# Patient Record
Sex: Male | Born: 1960 | Race: Black or African American | Hispanic: No | Marital: Married | Smoking: Former smoker
Health system: Southern US, Community
[De-identification: ages and names within clinical notes are randomized; demographics above are authoritative.]

## PROBLEM LIST (undated history)

## (undated) DIAGNOSIS — J309 Allergic rhinitis, unspecified: Secondary | ICD-10-CM

## (undated) DIAGNOSIS — I10 Essential (primary) hypertension: Secondary | ICD-10-CM

## (undated) DIAGNOSIS — M1990 Unspecified osteoarthritis, unspecified site: Secondary | ICD-10-CM

## (undated) DIAGNOSIS — E785 Hyperlipidemia, unspecified: Secondary | ICD-10-CM

## (undated) DIAGNOSIS — Z972 Presence of dental prosthetic device (complete) (partial): Secondary | ICD-10-CM

## (undated) HISTORY — DX: Allergic rhinitis, unspecified: J30.9

## (undated) HISTORY — DX: Essential (primary) hypertension: I10

## (undated) HISTORY — PX: HERNIA REPAIR: SHX51

## (undated) HISTORY — DX: Hyperlipidemia, unspecified: E78.5

---

## 1998-11-25 ENCOUNTER — Ambulatory Visit (HOSPITAL_COMMUNITY): Admission: RE | Admit: 1998-11-25 | Discharge: 1998-11-25 | Payer: Self-pay | Admitting: Neurological Surgery

## 1998-11-25 ENCOUNTER — Encounter: Payer: Self-pay | Admitting: Neurological Surgery

## 2007-08-08 ENCOUNTER — Ambulatory Visit: Payer: Self-pay | Admitting: General Surgery

## 2010-07-07 ENCOUNTER — Ambulatory Visit: Payer: Self-pay | Admitting: Gastroenterology

## 2010-07-07 HISTORY — PX: COLONOSCOPY: SHX174

## 2011-11-23 ENCOUNTER — Emergency Department: Payer: Self-pay | Admitting: Emergency Medicine

## 2015-09-12 ENCOUNTER — Telehealth: Payer: Self-pay | Admitting: Family Medicine

## 2015-09-12 NOTE — Telephone Encounter (Signed)
Pt's wife Larene Beach would like to get pt re-established with Nadine Counts. I believe pt is still a paper chart for our office I didn't find much in [employer]. Insurance [employer]. No medications or medical conditions. Wife stated pt has issues with allergies that he needs to be seen for. Can we re-establish pt? Please advise. Thanks TNP

## 2015-09-13 NOTE — Telephone Encounter (Signed)
Spoke with pt's wife and schedule appt for 09/16/15 @ 9 am. Thanks TNP

## 2015-09-13 NOTE — Telephone Encounter (Signed)
Ok to re-establish with 30 minute appointment

## 2015-09-14 DIAGNOSIS — M4802 Spinal stenosis, cervical region: Secondary | ICD-10-CM | POA: Insufficient documentation

## 2015-09-14 DIAGNOSIS — E7800 Pure hypercholesterolemia, unspecified: Secondary | ICD-10-CM | POA: Insufficient documentation

## 2015-09-14 DIAGNOSIS — E785 Hyperlipidemia, unspecified: Secondary | ICD-10-CM | POA: Insufficient documentation

## 2015-09-14 DIAGNOSIS — J309 Allergic rhinitis, unspecified: Secondary | ICD-10-CM | POA: Insufficient documentation

## 2015-09-16 ENCOUNTER — Encounter: Payer: Self-pay | Admitting: Family Medicine

## 2015-09-16 ENCOUNTER — Ambulatory Visit (INDEPENDENT_AMBULATORY_CARE_PROVIDER_SITE_OTHER): Payer: 59 | Admitting: Family Medicine

## 2015-09-16 VITALS — BP 144/100 | HR 76 | Temp 97.6°F | Resp 16 | Ht 67.0 in | Wt 189.8 lb

## 2015-09-16 DIAGNOSIS — J309 Allergic rhinitis, unspecified: Secondary | ICD-10-CM | POA: Diagnosis not present

## 2015-09-16 DIAGNOSIS — IMO0001 Reserved for inherently not codable concepts without codable children: Secondary | ICD-10-CM

## 2015-09-16 DIAGNOSIS — R030 Elevated blood-pressure reading, without diagnosis of hypertension: Secondary | ICD-10-CM

## 2015-09-16 MED ORDER — FLUTICASONE PROPIONATE 50 MCG/ACT NA SUSP: 2.0000 | Freq: Every day | NASAL | Status: DC

## 2015-09-16 NOTE — Patient Instructions (Signed)
Add Claritin daily to the nasal spray. Come in for nurse bp checks once or twice a week for 2 weeks.

## 2015-09-16 NOTE — Progress Notes (Signed)
Subjective:     Patient ID: Grant Golden, male   DOB: [birth year], 55 y.o.   MRN: [id number]  HPI  Chief Complaint  Patient presents with  . Establish Care    Patient comes into office today to reestablish, patient states that during his absence he had not been seen by another physcian. Patient wanted to address his allergies today, patient reports for the past month that he has had a runny nose. Patient denies taking any otc medication, he stated that when he blows his nose he feels dizzy and this is more noticable in the mornings.   States he has been allergy tested in the past and is allergic "to everything" except the cat they have at home. Reports taking Claritin intermittently for his sx.   Review of Systems  Cardiovascular:       Raised in foster homes so not sure of his family history.       Objective:   Physical Exam  Constitutional: He appears well-developed and well-nourished. No distress.  Cardiovascular: Normal rate and regular rhythm.   Ears: T.M's intact without inflammation Throat: no tonsillar enlargement or exudate Neck: no cervical adenopathy Lungs: clear     Assessment:    1. Allergic rhinitis, unspecified allergic rhinitis typ - fluticasone (FLONASE) 50 MCG/ACT nasal spray; Place 2 sprays into both nostrils daily.  Dispense: 16 g; Refill: 6  2. Elevated blood pressure    Plan:    Add Claritin daily. Nurse bp checks 1-2 x week over the next two weeks.

## 2015-12-26 ENCOUNTER — Encounter: Payer: Self-pay | Admitting: Family Medicine

## 2015-12-26 ENCOUNTER — Ambulatory Visit (INDEPENDENT_AMBULATORY_CARE_PROVIDER_SITE_OTHER): Payer: 59 | Admitting: Family Medicine

## 2015-12-26 VITALS — BP 136/70 | HR 82 | Temp 98.2°F | Resp 16 | Wt 186.4 lb

## 2015-12-26 DIAGNOSIS — J069 Acute upper respiratory infection, unspecified: Secondary | ICD-10-CM | POA: Diagnosis not present

## 2015-12-26 NOTE — Patient Instructions (Signed)
Continue with Mucinex and add Delsym for cough. For sinus congestion add decongestant and Benadryl at night for post nasal drainage.

## 2015-12-26 NOTE — Progress Notes (Signed)
Subjective:     Patient ID: Grant SchirmerFrank B Janssen, male   DOB: 07-21-1961, 55 y.o.   MRN: [id number]  HPI  Chief Complaint  Patient presents with  . Cough    Patient comes in office today with complaints of cough and chest congestion 12/23/15. Patient states that when he woke the next morning he had body aches, chills and fever that was high of 100. Associated with cough patient complains of sore throat. He has been taking otc Mucinex with no relief.       Review of Systems     Objective:   Physical Exam  Constitutional: He appears well-developed and well-nourished. No distress.  Ears: T.M's intact without inflammation Throat: no tonsillar enlargement or exudate, mild posterior pharyngeal erythema. Neck: no cervical adenopathy Lungs: clear     Assessment:    1. Upper respiratory infection    Plan:    Discussed otc medication. Work excuse for 5/8-5/9.

## 2015-12-27 ENCOUNTER — Telehealth: Payer: Self-pay | Admitting: Family Medicine

## 2015-12-27 NOTE — Telephone Encounter (Signed)
Tried calling patient, no answer. Will try again later.  

## 2015-12-27 NOTE — Telephone Encounter (Signed)
Pt is returning call.  [phone]/WJCB#[phone]/MW

## 2015-12-27 NOTE — Telephone Encounter (Signed)
Patient was advised to take otc medication as directed in office visit. KW

## 2015-12-27 NOTE — Telephone Encounter (Signed)
Pt stated that when he was here yesterday 12/26/15 he saw Nadine CountsBob and was advised to start taking medication and thought [facility]. Pt stated that when he was leaving he asked someone in the office if his scripts had been sent in and he said he was advised that they were sent in for him. I didn't see any new RX for pt and the medications he was advised to try in the OV note are all OTC medications. Pt just wanted to make sure that he didn't need an antibiotic. Please advise. Thanks TNP

## 2016-01-05 ENCOUNTER — Ambulatory Visit (INDEPENDENT_AMBULATORY_CARE_PROVIDER_SITE_OTHER): Payer: 59 | Admitting: Family Medicine

## 2016-01-05 ENCOUNTER — Encounter: Payer: Self-pay | Admitting: Family Medicine

## 2016-01-05 VITALS — BP 100/60 | HR 54 | Temp 98.2°F | Resp 16 | Wt 186.0 lb

## 2016-01-05 DIAGNOSIS — R059 Cough, unspecified: Secondary | ICD-10-CM

## 2016-01-05 DIAGNOSIS — R05 Cough: Secondary | ICD-10-CM

## 2016-01-05 DIAGNOSIS — J029 Acute pharyngitis, unspecified: Secondary | ICD-10-CM | POA: Diagnosis not present

## 2016-01-05 MED ORDER — SUCRALFATE 1 G PO TABS: 1.0000 g | ORAL_TABLET | Freq: Three times a day (TID) | ORAL | Status: DC

## 2016-01-05 NOTE — Progress Notes (Signed)
Subjective:     Patient ID: Grant SchirmerFrank B Bow, male   DOB: Jul 01, 1961, 55 y.o.   MRN: [id number]  HPI  Chief Complaint  Patient presents with  . Sore Throat    Patient comes in office today with complaints of sore throat since 12/27/15. Patient states that he was seen in office 12/26/15 and reported having flu like symptoms, patient states that he has been taing otc medication but sore throat seemed to get worse these past few days. Associated patient complains of chest congestion and bilateral ear pain.   States his throat bothers him the most in the AM then will recur at the end of the day. Cough is productive of white mucus. Reports he does not feel particularly sick. He does lift in his job and weight lifts to keep in shape. Has not been working out since he started to feel bad.   Review of Systems     Objective:   Physical Exam  Constitutional: He appears well-developed and well-nourished. No distress.  Ears: T.M's intact without inflammation Sinuses: non-tender Throat: no tonsillar enlargement or exudate Neck: no cervical adenopathy Lungs: clear     Assessment:    1. Sore throat: suspect mediated by reflux - sucralfate (CARAFATE) 1 g tablet; Take 1 tablet (1 g total) by mouth 4 (four) times daily -  with meals and at bedtime.  Dispense: 28 tablet; Refill: 0  2. Cough: ? Reflux mediated-samples of Dexilant #10 provided. - sucralfate (CARAFATE) 1 g tablet; Take 1 tablet (1 g total) by mouth 4 (four) times daily -  with meals and at bedtime.  Dispense: 28 tablet; Refill: 0    Plan:   Phone f/u next week.

## 2016-01-05 NOTE — Patient Instructions (Signed)
Take one Dexilant in the evening. Give it a few days to turn down the acid pumps. Let me know next week how you are doing.

## 2016-10-23 ENCOUNTER — Encounter: Payer: Self-pay | Admitting: Family Medicine

## 2016-10-23 ENCOUNTER — Ambulatory Visit (INDEPENDENT_AMBULATORY_CARE_PROVIDER_SITE_OTHER): Payer: 59 | Admitting: Family Medicine

## 2016-10-23 VITALS — BP 144/90 | HR 88 | Temp 98.0°F | Resp 16 | Ht 67.5 in | Wt 185.6 lb

## 2016-10-23 DIAGNOSIS — Z0000 Encounter for general adult medical examination without abnormal findings: Secondary | ICD-10-CM | POA: Diagnosis not present

## 2016-10-23 DIAGNOSIS — Z1159 Encounter for screening for other viral diseases: Secondary | ICD-10-CM

## 2016-10-23 DIAGNOSIS — E782 Mixed hyperlipidemia: Secondary | ICD-10-CM | POA: Diagnosis not present

## 2016-10-23 DIAGNOSIS — J3089 Other allergic rhinitis: Secondary | ICD-10-CM | POA: Diagnosis not present

## 2016-10-23 DIAGNOSIS — R030 Elevated blood-pressure reading, without diagnosis of hypertension: Secondary | ICD-10-CM

## 2016-10-23 MED ORDER — FLUTICASONE PROPIONATE 50 MCG/ACT NA SUSP: 2.0000 | Freq: Every day | NASAL | 6 refills | Status: DC

## 2016-10-23 NOTE — Progress Notes (Signed)
Subjective:     Patient ID: Grant Golden, male   DOB: 01-06-1961, 56 y.o.   MRN: [id number]  HPI  Chief Complaint  Patient presents with  . Annual Exam    Patient comes in office today for his annual physical he states that he feels well today and has no complaints or concerns to address. Patient reports that he follows a well balance diet but is not actively exercising, patient averages up to 7hrs of sleep a night. Patient last reported Tdap was 6/60/11, he is due today for Flu vaccine but has declined. Last reported Colonoscopy was 07/07/10 WNL, PSA 03/01/10.  States he continues to work for [employer] on first shift. 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 This resolves by the next day. He has discontinued weight lifting this year out of concern for his back.States he is going to a 7 day/week work cycle.   Review of Systems General: Feeling well HEENT: annual dental visits and eye exams. Perennial allergies controlled by Flonase and he wishes a refill on medication. Cardiovascular: no chest pain, shortness of breath, or palpitations GI: no heartburn, no change in bowel habits or blood in the stool GU: nocturia x 0- 1, no change in bladder habits  Psychiatric: not depressed, but reports decreased motivation for work where he has been for 30 years. Musculoskeletal: recurrent back tightness as above.    Objective:   Physical Exam  Constitutional: He appears well-developed and well-nourished. No distress.  Eyes: PERRLA, EOMI Neck: no thyromegaly, tenderness or nodules, no carotid bruits or cervical nodes appreciated ENT: TM's intact without inflammation; No tonsillar enlargement or exudate, Lungs: Clear Heart : RRR without murmur or gallop Abd: bowel sounds present, soft, non-tender, no organomegaly Rectal: Prostate not well felt by examiner Extremities: no edema. SLR's negative for back pain or  radiation of pain     Assessment:    1. Annual physical exam - Comprehensive metabolic panel - PSA  2. Elevated blood-pressure reading without diagnosis of hypertension  3. Chronic non-seasonal allergic rhinitis, unspecified trigger - fluticasone (FLONASE) 50 MCG/ACT nasal spray; Place 2 sprays into both nostrils daily.  Dispense: 16 g; Refill: 6  4. Mixed hyperlipidemia - Lipid panel  5. Encounter for hepatitis C screening test for low risk patient - Hepatitis C Antibody    Plan:    Return for nurse bp check in one week. Further f/u pending lab work. Provided with a back exercise sheet and discussed using heat at the end of his workday to relieve spasms.

## 2016-10-23 NOTE — Patient Instructions (Signed)
Please return for a nurse bp check in a week. We will call you with the lab results. Apply heat for 20 minutes for back tightness at the end of your workday. Try the back exercise daily before work.

## 2016-10-24 ENCOUNTER — Telehealth: Payer: Self-pay

## 2016-10-24 LAB — COMPREHENSIVE METABOLIC PANEL
ALT: 26 IU/L (ref 0–44)
AST: 22 IU/L (ref 0–40)
Albumin/Globulin Ratio: 1.4 (ref 1.2–2.2)
Albumin: 4.2 g/dL (ref 3.5–5.5)
Alkaline Phosphatase: 91 IU/L (ref 39–117)
BUN/Creatinine Ratio: 13 (ref 9–20)
BUN: 11 mg/dL (ref 6–24)
Bilirubin Total: 0.3 mg/dL (ref 0.0–1.2)
CO2: 24 mmol/L (ref 18–29)
Calcium: 9.7 mg/dL (ref 8.7–10.2)
Chloride: 100 mmol/L (ref 96–106)
Creatinine, Ser: 0.83 mg/dL (ref 0.76–1.27)
GFR calc Af Amer: 113 mL/min/{1.73_m2} (ref >59)
GFR calc non Af Amer: 98 mL/min/{1.73_m2} (ref >59)
Globulin, Total: 3 g/dL (ref 1.5–4.5)
Glucose: 102 mg/dL — ABNORMAL HIGH (ref 65–99)
Potassium: 4.3 mmol/L (ref 3.5–5.2)
Sodium: 139 mmol/L (ref 134–144)
Total Protein: 7.2 g/dL (ref 6.0–8.5)

## 2016-10-24 LAB — LIPID PANEL
Chol/HDL Ratio: 4.8 ratio units (ref 0.0–5.0)
Cholesterol, Total: 262 mg/dL — ABNORMAL HIGH (ref 100–199)
HDL: 55 mg/dL (ref >39)
LDL Calculated: 186 mg/dL — ABNORMAL HIGH (ref 0–99)
Triglycerides: 103 mg/dL (ref 0–149)
VLDL Cholesterol Cal: 21 mg/dL (ref 5–40)

## 2016-10-24 LAB — HEPATITIS C ANTIBODY: Hep C Virus Ab: 0.1 s/co ratio (ref 0.0–0.9)

## 2016-10-24 LAB — PSA: Prostate Specific Ag, Serum: 1.4 ng/mL (ref 0.0–4.0)

## 2016-10-24 NOTE — Telephone Encounter (Signed)
LMTCB-KW 

## 2016-10-24 NOTE — Telephone Encounter (Signed)
-----   Message from Anola Gurneyobert Chauvin, [place] sent at 10/24/2016  7:34 AM EST ----- Labs are ok except your cholesterol. Your calculated 10 year risk for developing cardiovascular disease is 9.4%. We usually start a cholesterol lowering drug at 7.5% risk. Do you wish to proceed with medication?

## 2016-10-24 NOTE — Telephone Encounter (Signed)
Pt does agree to start medication. [facility]. Emily Drozdowski, CMA

## 2016-10-25 ENCOUNTER — Other Ambulatory Visit: Payer: Self-pay | Admitting: Family Medicine

## 2016-10-25 DIAGNOSIS — E782 Mixed hyperlipidemia: Secondary | ICD-10-CM

## 2016-10-25 MED ORDER — PRAVASTATIN SODIUM 40 MG PO TABS: 40.0000 mg | ORAL_TABLET | Freq: Every day | ORAL | 3 refills | Status: DC

## 2016-10-25 NOTE — Telephone Encounter (Signed)
Have sent in medication. Call for lab slip in 6-8 weeks to recheck your cholesterol.

## 2016-10-25 NOTE — Telephone Encounter (Signed)
LMTCB-KW 

## 2016-10-26 NOTE — Telephone Encounter (Signed)
LMTCB-KW 

## 2016-10-29 NOTE — Telephone Encounter (Signed)
LMTCB-KW 

## 2016-10-31 ENCOUNTER — Telehealth: Payer: Self-pay

## 2016-10-31 NOTE — Telephone Encounter (Signed)
Patient walked in office today for nurse blood pressure check he states that he felt fine and had no questions to address, patients heart rate was 82 and respiration was 16. Blood pressure was take using a large cuff on left arm, reading was 142/82. KW

## 2016-11-01 NOTE — Telephone Encounter (Signed)
Patient was aware. KW

## 2016-11-01 NOTE — Telephone Encounter (Signed)
Let's do another nurse bp check next week.

## 2016-11-01 NOTE — Telephone Encounter (Signed)
LMTCB

## 2016-11-05 NOTE — Telephone Encounter (Signed)
Patient advised about another blood pressure reading needing to be check. He will come in tomorrow in the morning.  Thanks,  -Vietta Bonifield

## 2016-11-05 NOTE — Telephone Encounter (Signed)
LMTCB-KW 

## 2016-11-06 ENCOUNTER — Telehealth: Payer: Self-pay

## 2016-11-06 NOTE — Telephone Encounter (Signed)
Patient walked in office this morning for a nurse blood pressure check, he states that he is feeling well today. Patients blood pressure was taken on left arm with large cuff, reading was 152/90. Patient denied being under any stress or having frequent headaches, he states at times he does fill dizzy. Discussed with P.A about reading and as advised instructed patient to return in one week for office visit to address elevated blood pressure.

## 2016-11-13 ENCOUNTER — Ambulatory Visit (INDEPENDENT_AMBULATORY_CARE_PROVIDER_SITE_OTHER): Payer: 59 | Admitting: Family Medicine

## 2016-11-13 ENCOUNTER — Encounter: Payer: Self-pay | Admitting: Family Medicine

## 2016-11-13 VITALS — BP 134/78 | HR 79 | Temp 98.5°F | Resp 16 | Wt 188.8 lb

## 2016-11-13 DIAGNOSIS — R030 Elevated blood-pressure reading, without diagnosis of hypertension: Secondary | ICD-10-CM

## 2016-11-13 NOTE — Progress Notes (Signed)
Subjective:     Patient ID: Grant Golden, male   DOB: July 16, 1961, 56 y.o.   MRN: [id number]  HPI  Chief Complaint  Patient presents with  . Hypertension    Patient comes in office today with concerns of elevated blood pressure over the past month. Patient was seen for two nurse visits this month first one on 10/31/16, blood pressure at visit was 142/82. On 11/06/16 at nurse visit patient blood pressure was 152/90 and he reported dizziness. Patient has been checking readings at home with a stystolic reading of 133-165 and diastolic reading of 83-110. Patient still reports that at times he has dizzy spells.   States his father died on 3/18. No family hx of HTN or chronic nsaid use. Home bp's in the last weeks have ranged from 130's-165/80's-110. States he is tolerating pravastatin well.   Review of Systems     Objective:   Physical Exam  Constitutional: He appears well-developed and well-nourished. No distress.       Assessment:    1. Elevated blood-pressure reading without diagnosis of hypertension: ? situational     Plan:    Return in 4 weeks for recheck of bp and cholesterol.

## 2016-11-13 NOTE — Patient Instructions (Signed)
We will see you in 4 weeks. Continue to take your blood pressure 2-3 x week and record it for my review. We will check your cholesterol at next visit.

## 2016-12-11 ENCOUNTER — Encounter: Payer: Self-pay | Admitting: Family Medicine

## 2016-12-11 ENCOUNTER — Ambulatory Visit: Payer: 59 | Admitting: Family Medicine

## 2016-12-11 ENCOUNTER — Ambulatory Visit (INDEPENDENT_AMBULATORY_CARE_PROVIDER_SITE_OTHER): Payer: 59 | Admitting: Family Medicine

## 2016-12-11 VITALS — BP 122/88 | HR 72 | Temp 98.1°F | Resp 16 | Wt 184.6 lb

## 2016-12-11 DIAGNOSIS — M25532 Pain in left wrist: Secondary | ICD-10-CM

## 2016-12-11 DIAGNOSIS — G8929 Other chronic pain: Secondary | ICD-10-CM | POA: Diagnosis not present

## 2016-12-11 DIAGNOSIS — E782 Mixed hyperlipidemia: Secondary | ICD-10-CM | POA: Diagnosis not present

## 2016-12-11 DIAGNOSIS — J029 Acute pharyngitis, unspecified: Secondary | ICD-10-CM | POA: Diagnosis not present

## 2016-12-11 DIAGNOSIS — R030 Elevated blood-pressure reading, without diagnosis of hypertension: Secondary | ICD-10-CM

## 2016-12-11 MED ORDER — MELOXICAM 15 MG PO TABS: 15.0000 mg | ORAL_TABLET | Freq: Every day | ORAL | 0 refills | Status: DC

## 2016-12-11 NOTE — Progress Notes (Signed)
Subjective:     Patient ID: Grant Golden, male   DOB: 08/21/1960, 56 y.o.   MRN: [id number]  HPI  Chief Complaint  Patient presents with  . Hypertension    Patient comes in office today to address concerns of elevated blood pressure, last office visit was 11/07/16 blood pressure at visit was 134/78. Patient states since last visit it seems his blood pressure has been fluctuating, patient states that stressors have been lowered and he is doing well otherwise.   Marland Kitchen URI    Patient reports chest congestion and cough, patient reports that when he cough he feels a sharp pain on the right side of his neck radiating up to his ear. Patient describes pain radiating up to ear as " someone is clawing at my throat".   States his blood pressure was ok at recent dentist visit. Was seen in May for sore throat/cough felt secondary to reflux. He does not remember if Dexilant helped Did not take the Carafate. States his sore throat has been exacerbated by his recent URI sx. 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  Review of Systems     Objective:   Physical Exam  Constitutional: He appears well-developed and well-nourished. No distress.  Musculoskeletal:  Mild tenderness over left ulnar styloid. Increased pain with supination. WF/WE 5/5. No increased pain on wrist ligament testing.  Ears: T.M's intact without inflammation Throat: no tonsillar enlargement or exudate Neck: no cervical adenopathy Lungs: end expiratory wheezes     Assessment:    1. Sore throat: ? Reflux mediated  2. Wrist pain, chronic, left - meloxicam (MOBIC) 15 MG tablet; Take 1 tablet (15 mg total) by mouth daily.  Dispense: 30 tablet; Refill: 0  3. Mixed hyperlipidemia: check response to pravastatin - Lipid panel  4. Elevated blood-pressure reading without diagnosis of hypertension: normotensive today    Plan:    Discussed  use of Neoprene wrist splint. Consider x-ray/orthopedic referral. Samples of Nexium x 8 days-to continue otc if it helps. Further f/u pending lab results.

## 2016-12-11 NOTE — Patient Instructions (Signed)
Try to get a neoprene wrist splint to get your wrist more support. Try the meloxicam daily with food (don't mix with Aleve or Advil). Continue Nexium for at least two weeks (over the counter) if helping your throat pain. We will call you with the lab results.

## 2017-01-07 ENCOUNTER — Other Ambulatory Visit: Payer: Self-pay | Admitting: Family Medicine

## 2017-01-07 DIAGNOSIS — G8929 Other chronic pain: Secondary | ICD-10-CM

## 2017-01-07 DIAGNOSIS — M25532 Pain in left wrist: Principal | ICD-10-CM

## 2017-04-15 ENCOUNTER — Ambulatory Visit (INDEPENDENT_AMBULATORY_CARE_PROVIDER_SITE_OTHER): Payer: BLUE CROSS/BLUE SHIELD | Admitting: Family Medicine

## 2017-04-15 ENCOUNTER — Ambulatory Visit
Admission: RE | Admit: 2017-04-15 | Discharge: 2017-04-15 | Disposition: A | Discharge status: Home / Self Care | Payer: BLUE CROSS/BLUE SHIELD | Source: Ambulatory Visit | Attending: Family Medicine | Admitting: Family Medicine

## 2017-04-15 ENCOUNTER — Encounter: Payer: Self-pay | Admitting: Family Medicine

## 2017-04-15 ENCOUNTER — Telehealth: Payer: Self-pay

## 2017-04-15 ENCOUNTER — Telehealth: Payer: Self-pay | Admitting: Family Medicine

## 2017-04-15 VITALS — BP 142/82 | HR 102 | Temp 98.4°F | Resp 16 | Wt 191.6 lb

## 2017-04-15 DIAGNOSIS — G8929 Other chronic pain: Secondary | ICD-10-CM | POA: Diagnosis not present

## 2017-04-15 DIAGNOSIS — M546 Pain in thoracic spine: Secondary | ICD-10-CM | POA: Diagnosis not present

## 2017-04-15 DIAGNOSIS — M47814 Spondylosis without myelopathy or radiculopathy, thoracic region: Secondary | ICD-10-CM | POA: Insufficient documentation

## 2017-04-15 NOTE — Progress Notes (Signed)
Subjective:     Patient ID: Grant Golden, male   DOB: 1961-04-24, 56 y.o.   MRN: [id number]  HPI  Chief Complaint  Patient presents with  . Back Pain    Patient comes in office today with complaints of back pain on the right side intermittent for the past 2 months or more. Patient states that he feels tightness when exerting himself, such as pushing/pulling. Patient has been taking otc Ibuprofen for pain/discomfort.   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 States he will take ibuprofen x 3 and his back does not bother him until he attempts to lift. No radiation of pain. Pain not present on o.v. Today. Has hx of cervical DDD/disc herniation but no surgery: "My neck doesn't hurt."   Review of Systems  Cardiovascular:       Reports stopping pravastatin due to decreased urinary emptying and difficulty initiated stream. States sx resolved when he quit pravastatin       Objective:   Physical Exam  Constitutional: He appears well-developed and well-nourished. No distress.  Pulmonary/Chest: Breath sounds normal.  Musculoskeletal:  Muscle strength in lower extremities 5/5. SLR's to 90 degrees without radiation of back pain. Localizes pain to his right paravertebral thoracic area below his scapula. Non-tender to the touch.       Assessment:    1. Chronic right-sided thoracic back pain - DG Thoracic Spine W/Swimmers; Future    Plan:    Further f/u pending x-ray. Discussed that his body may not be able to handle the job as well and may need to rotate positions. Consider orthopedic referral.

## 2017-04-15 NOTE — Patient Instructions (Signed)
We will call you with the x-ray report 

## 2017-04-15 NOTE — Telephone Encounter (Signed)
-----   Message from Anola Gurney, [place] sent at 04/15/2017 12:07 PM EDT ----- Arthritic changes throughout your spine. I would recommend taking two Aleve twice daily with food for at least a week to see if your back pain will calm down. Would suggest trying to rotate to a job at [employer] that has less lifting.

## 2017-04-15 NOTE — Telephone Encounter (Signed)
LMTCB-KW 

## 2017-04-15 NOTE — Telephone Encounter (Signed)
Patient advised.KW 

## 2017-04-15 NOTE — Telephone Encounter (Signed)
Pt returned call

## 2017-09-12 ENCOUNTER — Encounter: Payer: Self-pay | Admitting: Family Medicine

## 2017-09-12 ENCOUNTER — Ambulatory Visit: Payer: BLUE CROSS/BLUE SHIELD | Admitting: Family Medicine

## 2017-09-12 VITALS — BP 142/110 | HR 87 | Temp 98.5°F | Resp 16 | Wt 194.0 lb

## 2017-09-12 DIAGNOSIS — J069 Acute upper respiratory infection, unspecified: Secondary | ICD-10-CM

## 2017-09-12 NOTE — Patient Instructions (Signed)
Discussed continued use of Mucinex, Sudafed PE, Salt water gargles. Stop antihistamines like Zyrtec as they may be drying your throat. Take two teaspoons of honey at night for cough. May also try Delsym.

## 2017-09-12 NOTE — Progress Notes (Signed)
Subjective:     Patient ID: Grant Golden, male   DOB: June 13, 1961, 57 y.o.   MRN: [id number] Chief Complaint  Patient presents with  . Sore Throat    Patient comes in office today with complaints of dry cough and sore throat for the past 3 days. Patient complains of difficulty swallowing liquids and solids and pressure in right ear. Patient has tried otc Mucinex and Ibuprofen.    HPI Reports cold sx over the last 4 days. Has also been using Zyrtec D in addition to the above.  Review of Systems  Constitutional: Negative for chills and fever.       Objective:   Physical Exam  Constitutional: He appears well-developed and well-nourished. No distress.  Ears: T.M's intact without inflammation Throat: no tonsillar enlargement or exudate, moderate posterior pharyngeal erythema Neck: no cervical adenopathy Lungs: clear     Assessment:    1. Viral upper respiratory tract infection    Plan:    Discussed use of otc medication. Stop Zyrtec D. Use honey and Delsym for cough.

## 2017-09-13 ENCOUNTER — Emergency Department
Admission: EM | Admit: 2017-09-13 | Discharge: 2017-09-13 | Disposition: A | Discharge status: Home / Self Care | Payer: BLUE CROSS/BLUE SHIELD | Attending: Emergency Medicine | Admitting: Emergency Medicine

## 2017-09-13 ENCOUNTER — Emergency Department: Payer: BLUE CROSS/BLUE SHIELD

## 2017-09-13 DIAGNOSIS — J029 Acute pharyngitis, unspecified: Secondary | ICD-10-CM | POA: Diagnosis not present

## 2017-09-13 DIAGNOSIS — R05 Cough: Secondary | ICD-10-CM | POA: Insufficient documentation

## 2017-09-13 DIAGNOSIS — Z87891 Personal history of nicotine dependence: Secondary | ICD-10-CM | POA: Insufficient documentation

## 2017-09-13 DIAGNOSIS — Z79899 Other long term (current) drug therapy: Secondary | ICD-10-CM | POA: Diagnosis not present

## 2017-09-13 LAB — INFLUENZA PANEL BY PCR (TYPE A & B)
Influenza A By PCR: NEGATIVE
Influenza B By PCR: NEGATIVE

## 2017-09-13 LAB — GROUP A STREP BY PCR: Group A Strep by PCR: NOT DETECTED

## 2017-09-13 MED ORDER — HYDROCOD POLST-CPM POLST ER 10-8 MG/5ML PO SUER: 5.0000 mL | Freq: Two times a day (BID) | ORAL | 0 refills | Status: DC | PRN

## 2017-09-13 MED ORDER — AMOXICILLIN-POT CLAVULANATE 875-125 MG PO TABS
1.0000 | ORAL_TABLET | Freq: Once | ORAL | Status: AC
  Administered 2017-09-13: 1 via ORAL
  Filled 2017-09-13: qty 1

## 2017-09-13 MED ORDER — LIDOCAINE VISCOUS 2 % MT SOLN
15.0000 mL | Freq: Once | OROMUCOSAL | Status: AC
Start: 2017-09-13 — End: 2017-09-13
  Administered 2017-09-13: 15 mL via OROMUCOSAL
  Filled 2017-09-13: qty 15

## 2017-09-13 MED ORDER — AMOXICILLIN-POT CLAVULANATE 875-125 MG PO TABS: 1.0000 | ORAL_TABLET | Freq: Two times a day (BID) | ORAL | 0 refills | Status: AC

## 2017-09-13 MED ORDER — HYDROCOD POLST-CPM POLST ER 10-8 MG/5ML PO SUER
5.0000 mL | Freq: Once | ORAL | Status: AC
  Administered 2017-09-13: 5 mL via ORAL
  Filled 2017-09-13: qty 5

## 2017-09-13 NOTE — ED Triage Notes (Signed)
Patient c/o sore throat beginning Monday that has been gradually increasing in severity. Patient coughing in triage. Patient reports productive cough. Patient denies fever, headache and nausea.

## 2017-09-13 NOTE — ED Provider Notes (Signed)
[facility] Emergency Department Provider Note   First MD Initiated Contact with Patient 09/13/17 [phone]     (approximate)  I have reviewed the triage vital signs and the nursing notes.   HISTORY  Chief Complaint Sore Throat and Cough    HPI Grant Golden is a 57 y.o. male with below list of chronic medical conditions presents to the emergency department with sore throat times 4 days which has progressively worsened since onset.  Patient also admits to a productive cough.  Patient admits to subjective fevers as well.  No known sick contact.   Past Medical History:  Diagnosis Date  . Allergic rhinitis   . Hyperlipidemia     Patient Active Problem List   Diagnosis Date Noted  . Allergic rhinitis 09/14/2015  . Cervical spinal stenosis 09/14/2015  . HLD (hyperlipidemia) 09/14/2015    Past Surgical History:  Procedure Laterality Date  . COLONOSCOPY  07/07/2010   entire colon was normal repeat in 47yrs  . HERNIA REPAIR Left     Prior to Admission medications   Medication Sig Start Date End Date Taking? Authorizing Provider  amoxicillin-clavulanate (AUGMENTIN) 875-125 MG tablet Take 1 tablet by mouth 2 (two) times daily for 10 days. 09/13/17 09/23/17  Darci Current, MD  chlorpheniramine-HYDROcodone [facility] PENNKINETIC ER) 10-8 MG/5ML SUER Take 5 mLs by mouth every 12 (twelve) hours as needed. 09/13/17   Darci Current, MD  fluticasone (FLONASE) 50 MCG/ACT nasal spray Place 2 sprays into both nostrils daily. Patient not taking: Reported on 04/15/2017 10/23/16   Anola Gurney, PA  pravastatin (PRAVACHOL) 40 MG tablet Take 1 tablet (40 mg total) by mouth daily. Patient not taking: Reported on 04/15/2017 10/25/16   Anola Gurney, PA    Allergies  No known drug allergies  Family History  Problem Relation Age of Onset  . Arthritis Father     Social History Social History   Tobacco Use  . Smoking status: Former Smoker    Types: Cigarettes    . Smokeless tobacco: Never Used  . Tobacco comment: Quit smoking back in 2001  Substance Use Topics  . Alcohol use: Yes    Alcohol/week: 0.0 oz  . Drug use: No    Review of Systems Constitutional: No fever/chills Eyes: No visual changes. ENT: Positive for sore throat. Cardiovascular: Denies chest pain. Respiratory: Denies shortness of breath.  Positive for productive cough Gastrointestinal: No abdominal pain.  No nausea, no vomiting.  No diarrhea.  No constipation. Genitourinary: Negative for dysuria. Musculoskeletal: Negative for neck pain.  Negative for back pain. Integumentary: Negative for rash. Neurological: Negative for headaches, focal weakness or numbness.   ____________________________________________   PHYSICAL EXAM:  VITAL SIGNS: ED Triage Vitals  Enc Vitals Group     BP 09/13/17 0018 140/85     Pulse Rate 09/13/17 0018 93     Resp 09/13/17 0018 17     Temp 09/13/17 0018 97.9 F (36.6 C)     Temp Source 09/13/17 0018 Oral     SpO2 09/13/17 0018 96 %     Weight 09/13/17 0020 88 kg (194 lb)     Height 09/13/17 0020 1.702 m (5\' 7" )     Head Circumference --      Peak Flow --      Pain Score 09/13/17 0018 9     Pain Loc --      Pain Edu? --      Excl. in GC? --  Constitutional: Alert and oriented. Well appearing and in no acute distress. Eyes: Conjunctivae are normal.  Head: Atraumatic. Mouth/Throat: Mucous membranes are moist.  Pharyngeal erythema with exudate  neck: No stridor.  No meningeal signs.   Cardiovascular: Normal rate, regular rhythm. Good peripheral circulation. Grossly normal heart sounds. Respiratory: Normal respiratory effort.  No retractions. Lungs CTAB. Gastrointestinal: Soft and nontender. No distention.   Musculoskeletal: No lower extremity tenderness nor edema. No gross deformities of extremities. Neurologic:  Normal speech and language. No gross focal neurologic deficits are appreciated.  Skin:  Skin is warm, dry and intact. No  rash noted. Psychiatric: Mood and affect are normal. Speech and behavior are normal.  ____________________________________________   LABS (all labs ordered are listed, but only abnormal results are displayed)  Labs Reviewed  GROUP A STREP BY PCR  INFLUENZA PANEL BY PCR (TYPE A & B)     RADIOLOGY I, [place] Erdem Naas, personally viewed and evaluated these images (plain radiographs) as part of my medical decision making, as well as reviewing the written report by the radiologist.  Dg Chest 2 View  Result Date: 09/13/2017 CLINICAL DATA:  Sore throat beginning on Monday, gradually increasing. Productive cough. EXAM: CHEST  2 VIEW COMPARISON:  None. FINDINGS: Normal heart size and pulmonary vascularity. No focal airspace disease or consolidation in the lungs. No blunting of costophrenic angles. No pneumothorax. Mediastinal contours appear intact. Degenerative changes in the spine and shoulders. IMPRESSION: No active cardiopulmonary disease. Electronically Signed   By: Burman NievesWilliam  Stevens M.D.   On: 09/13/2017 01:01      Procedures   ____________________________________________   INITIAL IMPRESSION / ASSESSMENT AND PLAN / ED COURSE  As part of my medical decision making, I reviewed the following data within the electronic MEDICAL RECORD [id number] year old male presenting to the emergency department with above-stated history consistent with pharyngitis versus influenza pneumonia or bronchitis chest x-ray negative insulin group A strep PCR negative. ____________________________________________  FINAL CLINICAL IMPRESSION(S) / ED DIAGNOSES  Final diagnoses:  Acute pharyngitis, unspecified etiology     MEDICATIONS GIVEN DURING THIS VISIT:  Medications  amoxicillin-clavulanate (AUGMENTIN) 875-125 MG per tablet 1 tablet (1 tablet Oral Given 09/13/17 0200)  chlorpheniramine-HYDROcodone (TUSSIONEX) 10-8 MG/5ML suspension 5 mL (5 mLs Oral Given 09/13/17 0200)  lidocaine (XYLOCAINE) 2 % viscous  mouth solution 15 mL (15 mLs Mouth/Throat Given 09/13/17 0200)     ED Discharge Orders        Ordered    amoxicillin-clavulanate (AUGMENTIN) 875-125 MG tablet  2 times daily     09/13/17 0246    chlorpheniramine-HYDROcodone (TUSSIONEX PENNKINETIC ER) 10-8 MG/5ML SUER  Every 12 hours PRN     09/13/17 0246       Note:  This document was prepared using Dragon voice recognition software and may include unintentional dictation errors.    Darci CurrentBrown, Stanton N, MD 09/13/17 [id number]

## 2018-10-03 ENCOUNTER — Ambulatory Visit (INDEPENDENT_AMBULATORY_CARE_PROVIDER_SITE_OTHER): Payer: BLUE CROSS/BLUE SHIELD | Admitting: Physician Assistant

## 2018-10-03 ENCOUNTER — Encounter: Payer: Self-pay | Admitting: Physician Assistant

## 2018-10-03 VITALS — BP 136/82 | HR 76 | Temp 98.3°F | Resp 16 | Wt 178.0 lb

## 2018-10-03 DIAGNOSIS — R69 Illness, unspecified: Secondary | ICD-10-CM

## 2018-10-03 DIAGNOSIS — J029 Acute pharyngitis, unspecified: Secondary | ICD-10-CM

## 2018-10-03 DIAGNOSIS — J111 Influenza due to unidentified influenza virus with other respiratory manifestations: Secondary | ICD-10-CM

## 2018-10-03 LAB — POCT RAPID STREP A (OFFICE): Rapid Strep A Screen: NEGATIVE

## 2018-10-03 MED ORDER — OSELTAMIVIR PHOSPHATE 75 MG PO CAPS: 75.0000 mg | ORAL_CAPSULE | Freq: Two times a day (BID) | ORAL | 0 refills | Status: AC

## 2018-10-03 NOTE — Progress Notes (Signed)
Patient: Grant Golden Male    DOB: 03/13/1961   58 y.o.   MRN: [id number] Visit Date: 10/03/2018  Today's Provider: Trey Sailors, PA-C   Chief Complaint  Patient presents with  . URI   Subjective:     HPI Upper Respiratory Infection: Patient complains of symptoms of a URI. Symptoms include cough and sore throat. Onset of symptoms was 5 days ago, unchanged since that time. He also c/o cough described as productive of green sputum, headache described as pressure, sinus pressure and sore throat for the past 5 days .  He is drinking plenty of fluids. Evaluation to date: none. Treatment to date: cough suppressants. Did not get flu shot. Several contacts at work are sick with flu.     No Known Allergies   Current Outpatient Medications:  .  oseltamivir (TAMIFLU) 75 MG capsule, Take 1 capsule (75 mg total) by mouth 2 (two) times daily for 5 days., Disp: 10 capsule, Rfl: 0  Review of Systems  Constitutional: Positive for appetite change and chills.  HENT: Positive for sinus pressure, sinus pain and sore throat.   Respiratory: Positive for cough.   Neurological: Positive for headaches.    Social History   Tobacco Use  . Smoking status: Former Smoker    Types: Cigarettes  . Smokeless tobacco: Never Used  . Tobacco comment: Quit smoking back in 2001  Substance Use Topics  . Alcohol use: Yes    Alcohol/week: 0.0 standard drinks      Objective:   BP 136/82 (BP Location: Left Arm, Patient Position: Sitting, Cuff Size: Normal)   Pulse 76   Temp 98.3 F (36.8 C) (Oral)   Resp 16   Wt 178 lb (80.7 kg)   SpO2 98%   BMI 27.88 kg/m  Vitals:   10/03/18 1007  BP: 136/82  Pulse: 76  Resp: 16  Temp: 98.3 F (36.8 C)  TempSrc: Oral  SpO2: 98%  Weight: 178 lb (80.7 kg)     Physical Exam Constitutional:      General: He is not in acute distress.    Appearance: He is well-developed. He is not diaphoretic.  HENT:     Right Ear: Tympanic membrane, ear canal  and external ear normal.     Left Ear: Tympanic membrane, ear canal and external ear normal.     Nose: Rhinorrhea present.     Right Sinus: No maxillary sinus tenderness or frontal sinus tenderness.     Left Sinus: No maxillary sinus tenderness or frontal sinus tenderness.     Mouth/Throat:     Mouth: Mucous membranes are moist.     Pharynx: Oropharynx is clear. Uvula midline. No oropharyngeal exudate.  Eyes:     General:        Right eye: Discharge present.        Left eye: Discharge present.    Conjunctiva/sclera: Conjunctivae normal.     Comments: Watery Discharge   Neck:     Musculoskeletal: Normal range of motion and neck supple.  Cardiovascular:     Rate and Rhythm: Normal rate and regular rhythm.     Heart sounds: Normal heart sounds.  Pulmonary:     Effort: Pulmonary effort is normal. No respiratory distress.     Breath sounds: Normal breath sounds. No wheezing or rales.  Lymphadenopathy:     Cervical: No cervical adenopathy.  Skin:    General: Skin is warm and dry.  Neurological:  Mental Status: He is alert and oriented to person, place, and time.  Psychiatric:        Behavior: Behavior normal.         Assessment & Plan    1. Influenza-like illness  Work note provided.   - oseltamivir (TAMIFLU) 75 MG capsule; Take 1 capsule (75 mg total) by mouth 2 (two) times daily for 5 days.  Dispense: 10 capsule; Refill: 0  2. Sore throat  Rapid strep negative.   - POCT rapid strep A  Return if symptoms worsen or fail to improve.  The entirety of the information documented in the History of Present Illness, Review of Systems and Physical Exam were personally obtained by me. Portions of this information were initially documented by Rondel Baton, CMA and reviewed by me for thoroughness and accuracy.      Trey Sailors, PA-C  [facility]

## 2018-10-03 NOTE — Patient Instructions (Signed)
Delsym for cough mucinex for mucous  Influenza, Adult Influenza is also called "the flu." It is an infection in the lungs, nose, and throat (respiratory tract). It is caused by a virus. The flu causes symptoms that are similar to symptoms of a cold. It also causes a high fever and body aches. The flu spreads easily from person to person (is contagious). Getting a flu shot (influenza vaccination) every year is the best way to prevent the flu. What are the causes? This condition is caused by the influenza virus. You can get the virus by:  Breathing in droplets that are in the air from the cough or sneeze of a person who has the virus.  Touching something that has the virus on it (is contaminated) and then touching your mouth, nose, or eyes. What increases the risk? Certain things may make you more likely to get the flu. These include:  Not washing your hands often.  Having close contact with many people during cold and flu season.  Touching your mouth, eyes, or nose without first washing your hands.  Not getting a flu shot every year. You may have a higher risk for the flu, along with serious problems such as a lung infection (pneumonia), if you:  Are older than 65.  Are pregnant.  Have a weakened disease-fighting system (immune system) because of a disease or taking certain medicines.  Have a long-term (chronic) illness, such as: ? Heart, kidney, or lung disease. ? Diabetes. ? Asthma.  Have a liver disorder.  Are very overweight (morbidly obese).  Have anemia. This is a condition that affects your red blood cells. What are the signs or symptoms? Symptoms usually begin suddenly and last 4-14 days. They may include:  Fever and chills.  Headaches, body aches, or muscle aches.  Sore throat.  Cough.  Runny or stuffy (congested) nose.  Chest discomfort.  Not wanting to eat as much as normal (poor appetite).  Weakness or feeling tired  (fatigue).  Dizziness.  Feeling sick to your stomach (nauseous) or throwing up (vomiting). How is this treated? If the flu is found early, you can be treated with medicine that can help reduce how bad the illness is and how long it lasts (antiviral medicine). This may be given by mouth (orally) or through an IV tube. Taking care of yourself at home can help your symptoms get better. Your doctor may suggest:  Taking over-the-counter medicines.  Drinking plenty of fluids. The flu often goes away on its own. If you have very bad symptoms or other problems, you may be treated in a hospital. Follow these instructions at home:     Activity  Rest as needed. Get plenty of sleep.  Stay home from work or school as told by your doctor. ? Do not leave home until you do not have a fever for 24 hours without taking medicine. ? Leave home only to visit your doctor. Eating and drinking  Take an ORS (oral rehydration solution). This is a drink that is sold at pharmacies and stores.  Drink enough fluid to keep your pee (urine) pale yellow.  Drink clear fluids in small amounts as you are able. Clear fluids include: ? Water. ? Ice chips. ? Fruit juice that has water added (diluted fruit juice). ? Low-calorie sports drinks.  Eat bland, easy-to-digest foods in small amounts as you are able. These foods include: ? Bananas. ? Applesauce. ? Rice. ? Lean meats. ? Toast. ? Crackers.  Do not eat or  drink: ? Fluids that have a lot of sugar or caffeine. ? Alcohol. ? Spicy or fatty foods. General instructions  Take over-the-counter and prescription medicines only as told by your doctor.  Use a cool mist humidifier to add moisture to the air in your home. This can make it easier for you to breathe.  Cover your mouth and nose when you cough or sneeze.  Wash your hands with soap and water often, especially after you cough or sneeze. If you cannot use soap and water, use alcohol-based hand  sanitizer.  Keep all follow-up visits as told by your doctor. This is important. How is this prevented?   Get a flu shot every year. You may get the flu shot in late summer, fall, or winter. Ask your doctor when you should get your flu shot.  Avoid contact with people who are sick during fall and winter (cold and flu season). Contact a doctor if:  You get new symptoms.  You have: ? Chest pain. ? Watery poop (diarrhea). ? A fever.  Your cough gets worse.  You start to have more mucus.  You feel sick to your stomach.  You throw up. Get help right away if you:  Have shortness of breath.  Have trouble breathing.  Have skin or nails that turn a bluish color.  Have very bad pain or stiffness in your neck.  Get a sudden headache.  Get sudden pain in your face or ear.  Cannot eat or drink without throwing up. Summary  Influenza ("the flu") is an infection in the lungs, nose, and throat. It is caused by a virus.  Take over-the-counter and prescription medicines only as told by your doctor.  Getting a flu shot every year is the best way to avoid getting the flu. This information is not intended to replace advice given to you by your health care provider. Make sure you discuss any questions you have with your health care provider. Document Released: 05/15/2008 Document Revised: 01/22/2018 Document Reviewed: 01/22/2018 Elsevier Interactive Patient Education  2019 [employer].

## 2018-12-13 ENCOUNTER — Ambulatory Visit
Admission: EM | Admit: 2018-12-13 | Discharge: 2018-12-13 | Disposition: A | Discharge status: Home / Self Care | Payer: BLUE CROSS/BLUE SHIELD | Attending: Family Medicine | Admitting: Family Medicine

## 2018-12-13 ENCOUNTER — Encounter: Payer: Self-pay | Admitting: Emergency Medicine

## 2018-12-13 ENCOUNTER — Other Ambulatory Visit: Payer: Self-pay

## 2018-12-13 DIAGNOSIS — S80261A Insect bite (nonvenomous), right knee, initial encounter: Secondary | ICD-10-CM | POA: Diagnosis not present

## 2018-12-13 DIAGNOSIS — W57XXXA Bitten or stung by nonvenomous insect and other nonvenomous arthropods, initial encounter: Secondary | ICD-10-CM

## 2018-12-13 MED ORDER — MUPIROCIN 2 % EX OINT: 1.0000 "application " | TOPICAL_OINTMENT | Freq: Two times a day (BID) | CUTANEOUS | 0 refills | Status: AC

## 2018-12-13 NOTE — ED Provider Notes (Signed)
[facility]    CSN: [id number] Arrival date & time: 12/13/18  1113  History   Chief Complaint Chief Complaint  Patient presents with  . Tick Removal   HPI  58 year old male presents with a tick bite.    Patient reports he noticed a tick still attached on his right lower extremity this morning.  Location: Behind the right knee.  Patient states that he asked his wife to remove the tick but she did not feel comfortable.  He thus came in for removal today.  He reports that he is having some pain of his right lower leg.  This is been going on for the past few days.  He did not notice a tick this morning.  His pain is currently 2/10 in severity.  No medications or interventions tried.  Patient desires to removal today.  No other complaints concerns at this time.  Of note, patient denies rash, fever.  PMH, Surgical Hx, Family Hx, Social History reviewed and updated as below.  Past Medical History:  Diagnosis Date  . Allergic rhinitis   . Hyperlipidemia     Patient Active Problem List   Diagnosis Date Noted  . Allergic rhinitis 09/14/2015  . Cervical spinal stenosis 09/14/2015  . HLD (hyperlipidemia) 09/14/2015    Past Surgical History:  Procedure Laterality Date  . COLONOSCOPY  07/07/2010   entire colon was normal repeat in 6127yrs  . HERNIA REPAIR Left     Home Medications    Prior to Admission medications   Medication Sig Start Date End Date Taking? Authorizing Provider  mupirocin ointment (BACTROBAN) 2 % Apply 1 application topically 2 (two) times daily for 7 days. 12/13/18 12/20/18  Tommie Samsook, Brendy Ficek G, DO    Family History Family History  Problem Relation Age of Onset  . Arthritis Father     Social History Social History   Tobacco Use  . Smoking status: Former Smoker    Types: Cigarettes  . Smokeless tobacco: Never Used  . Tobacco comment: Quit smoking back in 2001  Substance Use Topics  . Alcohol use: Yes    Alcohol/week: 0.0 standard drinks  . Drug  use: No     Allergies   Patient has no known allergies.   Review of Systems Review of Systems  Constitutional: Negative.   Skin: Negative for rash.       Tick bite.   Physical Exam Triage Vital Signs ED Triage Vitals  Enc Vitals Group     BP 12/13/18 1121 (!) 133/98     Pulse Rate 12/13/18 1121 79     Resp 12/13/18 1121 16     Temp 12/13/18 1121 98.1 F (36.7 C)     Temp Source 12/13/18 1121 Oral     SpO2 12/13/18 1121 98 %     Weight 12/13/18 1119 180 lb (81.6 kg)     Height 12/13/18 1119 5\' 7"  (1.702 m)     Head Circumference --      Peak Flow --      Pain Score 12/13/18 1119 2     Pain Loc --      Pain Edu? --      Excl. in GC? --     Updated Vital Signs BP (!) 133/98 (BP Location: Right Arm)   Pulse 79   Temp 98.1 F (36.7 C) (Oral)   Resp 16   Ht 5\' 7"  (1.702 m)   Wt 81.6 kg   SpO2 98%   BMI 28.19 kg/m  Visual Acuity Right Eye Distance:   Left Eye Distance:   Bilateral Distance:    Right Eye Near:   Left Eye Near:    Bilateral Near:     Physical Exam Constitutional:      General: He is not in acute distress.    Appearance: Normal appearance.  HENT:     Head: Normocephalic and atraumatic.     Nose: Nose normal. No rhinorrhea.  Eyes:     General:        Right eye: No discharge.        Left eye: No discharge.     Conjunctiva/sclera: Conjunctivae normal.  Pulmonary:     Effort: Pulmonary effort is normal. No respiratory distress.  Skin:    Comments: Popliteal fossa, right knee -not engorged tick attached.  Slightly raised skin surrounding the area.  No redness.  No rash.  Neurological:     Mental Status: He is alert.  Psychiatric:        Mood and Affect: Mood normal.        Behavior: Behavior normal.    UC Treatments / Results  Labs (all labs ordered are listed, but only abnormal results are displayed) Labs Reviewed - No data to display  EKG None  Radiology No results found.  Procedures Procedures (including critical care  time) Tick removal  Tick was grasped with a pair of forceps and removed without difficulty.  Medications Ordered in UC Medications - No data to display  Initial Impression / Assessment and Plan / UC Course  I have reviewed the triage vital signs and the nursing notes.  Pertinent labs & imaging results that were available during my care of the patient were reviewed by me and considered in my medical decision making (see chart for details).    58 year old male presents with a tick bite with tick still attached.  Tick removed today.  No signs or symptoms of Lyme disease.  No signs or symptoms of other tickborne illness.  Bactroban ointment twice daily.  Tylenol and ibuprofen as needed.  Supportive care.  Final Clinical Impressions(s) / UC Diagnoses   Final diagnoses:  Tick bite, initial encounter     Discharge Instructions     Antibiotic ointment as prescribed.  OTC Tylenol and/or Motrin as needed.  Take care  Dr. Adriana Simas    ED Prescriptions    Medication Sig Dispense Auth. Provider   mupirocin ointment (BACTROBAN) 2 % Apply 1 application topically 2 (two) times daily for 7 days. 22 g Tommie Sams, DO     Controlled Substance Prescriptions [place] Controlled Substance Registry consulted? Not Applicable   Tommie Sams, DO 12/13/18 1301

## 2018-12-13 NOTE — Discharge Instructions (Signed)
Antibiotic ointment as prescribed.  OTC Tylenol and/or Motrin as needed.  Take care  Dr. Adriana Simas

## 2018-12-13 NOTE — ED Triage Notes (Signed)
Patient states that he noticed a tick on his right lower leg this morning. Patient reports some some pain in his leg.

## 2019-04-30 ENCOUNTER — Telehealth: Payer: Self-pay | Admitting: Family Medicine

## 2019-05-05 ENCOUNTER — Ambulatory Visit: Payer: BLUE CROSS/BLUE SHIELD | Admitting: Physician Assistant

## 2019-05-05 ENCOUNTER — Other Ambulatory Visit: Payer: Self-pay

## 2019-05-05 ENCOUNTER — Encounter: Payer: Self-pay | Admitting: Physician Assistant

## 2019-05-05 ENCOUNTER — Ambulatory Visit (INDEPENDENT_AMBULATORY_CARE_PROVIDER_SITE_OTHER): Payer: BC Managed Care – PPO | Admitting: Physician Assistant

## 2019-05-05 VITALS — BP 135/93 | HR 73 | Temp 97.5°F | Resp 16 | Wt 178.0 lb

## 2019-05-05 DIAGNOSIS — R5383 Other fatigue: Secondary | ICD-10-CM

## 2019-05-05 DIAGNOSIS — E782 Mixed hyperlipidemia: Secondary | ICD-10-CM | POA: Diagnosis not present

## 2019-05-05 DIAGNOSIS — J4520 Mild intermittent asthma, uncomplicated: Secondary | ICD-10-CM | POA: Diagnosis not present

## 2019-05-05 MED ORDER — ALBUTEROL SULFATE HFA 108 (90 BASE) MCG/ACT IN AERS: 2.0000 | INHALATION_SPRAY | Freq: Four times a day (QID) | RESPIRATORY_TRACT | 1 refills | Status: DC | PRN

## 2019-05-05 NOTE — Patient Instructions (Addendum)
BP less than 140/90    Fatigue If you have fatigue, you feel tired all the time and have a lack of energy or a lack of motivation. Fatigue may make it difficult to start or complete tasks because of exhaustion. In general, occasional or mild fatigue is often a normal response to activity or life. However, long-lasting (chronic) or extreme fatigue may be a symptom of a medical condition. Follow these instructions at home: General instructions  Watch your fatigue for any changes.  Go to bed and get up at the same time every day.  Avoid fatigue by pacing yourself during the day and getting enough sleep at night.  Maintain a healthy weight. Medicines  Take over-the-counter and prescription medicines only as told by your health care provider.  Take a multivitamin, if told by your health care provider.  Do not use herbal or dietary supplements unless they are approved by your health care provider. Activity   Exercise regularly, as told by your health care provider.  Use or practice techniques to help you relax, such as yoga, tai chi, meditation, or massage therapy. Eating and drinking   Avoid heavy meals in the evening.  Eat a well-balanced diet, which includes lean proteins, whole grains, plenty of fruits and vegetables, and low-fat dairy products.  Avoid consuming too much caffeine.  Avoid the use of alcohol.  Drink enough fluid to keep your urine pale yellow. Lifestyle  Change situations that cause you stress. Try to keep your work and personal schedule in balance.  Do not use any products that contain nicotine or tobacco, such as cigarettes and e-cigarettes. If you need help quitting, ask your health care provider.  Do not use drugs. Contact a health care provider if:  Your fatigue does not get better.  You have a fever.  You suddenly lose or gain weight.  You have headaches.  You have trouble falling asleep or sleeping through the night.  You feel angry,  guilty, anxious, or sad.  You are unable to have a bowel movement (constipation).  Your skin is dry.  You have swelling in your legs or another part of your body. Get help right away if:  You feel confused.  Your vision is blurry.  You feel faint or you pass out.  You have a severe headache.  You have severe pain in your abdomen, your back, or the area between your waist and hips (pelvis).  You have chest pain, shortness of breath, or an irregular or fast heartbeat.  You are unable to urinate, or you urinate less than normal.  You have abnormal bleeding, such as bleeding from the rectum, vagina, nose, lungs, or nipples.  You vomit blood.  You have thoughts about hurting yourself or others. If you ever feel like you may hurt yourself or others, or have thoughts about taking your own life, get help right away. You can go to your nearest emergency department or call:  Your local emergency services (911 in the [place]  A suicide crisis helpline, such as the [place] at 306-834-3331. This is open 24 hours a day. Summary  If you have fatigue, you feel tired all the time and have a lack of energy or a lack of motivation.  Fatigue may make it difficult to start or complete tasks because of exhaustion.  Long-lasting (chronic) or extreme fatigue may be a symptom of a medical condition.  Exercise regularly, as told by your health care provider.  Change situations that  cause you stress. Try to keep your work and personal schedule in balance. This information is not intended to replace advice given to you by your health care provider. Make sure you discuss any questions you have with your health care provider. Document Released: 06/03/2007 Document Revised: 11/27/2018 Document Reviewed: 05/01/2017 Elsevier Patient Education  2020 [employer].

## 2019-05-05 NOTE — Progress Notes (Signed)
Patient: Shirleen SchirmerFrank B Finch Male    DOB: 29-Dec-1960   58 y.o.   MRN: [id number] Visit Date: 05/05/2019  Today's Provider: Margaretann LovelessJennifer M Marrianne Sica, PA-C   Chief Complaint  Patient presents with  . Fatigue   Subjective:     HPI   Patient states he has been feeling fatigued and drained intermittently all summer. Patient believes this may be due to humidity. 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 He noticed fatigue worsening over the last 3 months. Does report daytime somnolence as well and can fall asleep if nothing is going on around him. Has even fallen asleep for a short while stopped at a red light. He does snore per wife and may have had witnessed apnea. He is unsure. Patient states he has not been having any chest pain, sob or palpitations. However he does get a "catch"  in his chest occasionally. Does have h/o childhood asthma.   No Known Allergies  No current outpatient medications on file.  Review of Systems  Constitutional: Positive for fatigue. Negative for appetite change, chills, diaphoresis and fever.  Eyes: Negative for visual disturbance.  Respiratory: Negative for cough, chest tightness, shortness of breath and wheezing.   Cardiovascular: Negative for chest pain, palpitations and leg swelling.  Gastrointestinal: Negative for abdominal pain, constipation, diarrhea, nausea and vomiting.  Neurological: Negative for dizziness, weakness, numbness and headaches.    Social History   Tobacco Use  . Smoking status: Former Smoker    Types: Cigarettes  . Smokeless tobacco: Never Used  . Tobacco comment: Quit smoking back in 2001  Substance Use Topics  . Alcohol use: Yes    Alcohol/week: 0.0 standard drinks      Objective:   BP (!) 150/91 (BP Location: Left Arm, Patient Position: Sitting, Cuff Size: Large)   Pulse 73   Temp (!) 97.5 F (36.4 C) (Other (Comment))    Resp 16   Wt 178 lb (80.7 kg)   SpO2 98%   BMI 27.88 kg/m  Vitals:   05/05/19 1620  BP: (!) 150/91  Pulse: 73  Resp: 16  Temp: (!) 97.5 F (36.4 C)  TempSrc: Other (Comment)  SpO2: 98%  Weight: 178 lb (80.7 kg)  Body mass index is 27.88 kg/m.   Physical Exam Vitals signs reviewed.  Constitutional:      General: He is not in acute distress.    Appearance: Normal appearance. He is well-developed and normal weight. He is not ill-appearing or diaphoretic.  HENT:     Head: Normocephalic and atraumatic.  Eyes:     General: No scleral icterus.    Extraocular Movements: Extraocular movements intact.  Neck:     Musculoskeletal: Normal range of motion and neck supple.     Thyroid: No thyromegaly.     Vascular: No JVD.     Trachea: No tracheal deviation.  Cardiovascular:     Rate and Rhythm: Normal rate and regular rhythm.     Pulses: Normal pulses.     Heart sounds: Normal heart sounds. No murmur. No friction rub. No gallop.   Pulmonary:     Effort: Pulmonary effort is normal. No respiratory distress.     Breath sounds: Examination of the right-lower field reveals wheezing. Examination of the left-lower field reveals wheezing. Wheezing (faint expiraatory) present. No rales.  Lymphadenopathy:     Cervical: No cervical adenopathy.  Skin:    Capillary Refill: Capillary refill takes less than 2 seconds.  Neurological:     General: No focal deficit present.     Mental Status: He is alert and oriented to person, place, and time. Mental status is at baseline.  Psychiatric:        Mood and Affect: Mood normal.        Behavior: Behavior normal.        Thought Content: Thought content normal.        Judgment: Judgment normal.      No results found for any visits on 05/05/19.     Assessment & Plan    1. Fatigue, unspecified type Could be multiple causes. Will check labs as below and f/u pending results to r/o organic sources. Discussed if labs normal and BP readings at home  normal, may need sleep study.  - CBC w/Diff/Platelet - Comprehensive Metabolic Panel (CMET) - TSH - HgB A1c - Vitamin D (25 hydroxy) - B12 and Folate Panel  2. Mixed hyperlipidemia Diet controlled. Will check labs as below and f/u pending results. - Lipid Profile  3. Mild intermittent asthma, unspecified whether complicated Noted to have wheezing in lower lungs bilaterally, very faint expiratory. Patient is not a smoker. Does have h/o childhood asthma per patient.  Will give albuterol inhaler as below.  - albuterol (VENTOLIN HFA) 108 (90 Base) MCG/ACT inhaler; Inhale 2 puffs into the lungs every 6 (six) hours as needed for wheezing or shortness of breath.  Dispense: 18 g; Refill: [place], PA-C  [facility]

## 2019-05-08 DIAGNOSIS — R5383 Other fatigue: Secondary | ICD-10-CM | POA: Diagnosis not present

## 2019-05-09 LAB — CBC WITH DIFFERENTIAL/PLATELET
Basophils Absolute: 0.1 10*3/uL (ref 0.0–0.2)
Basos: 1 %
EOS (ABSOLUTE): 0.4 10*3/uL (ref 0.0–0.4)
Eos: 8 %
Hematocrit: 41.3 % (ref 37.5–51.0)
Hemoglobin: 14 g/dL (ref 13.0–17.7)
Immature Grans (Abs): 0 10*3/uL (ref 0.0–0.1)
Immature Granulocytes: 0 %
Lymphocytes Absolute: 1.7 10*3/uL (ref 0.7–3.1)
Lymphs: 34 %
MCH: 29.5 pg (ref 26.6–33.0)
MCHC: 33.9 g/dL (ref 31.5–35.7)
MCV: 87 fL (ref 79–97)
Monocytes Absolute: 0.6 10*3/uL (ref 0.1–0.9)
Monocytes: 12 %
Neutrophils Absolute: 2.3 10*3/uL (ref 1.4–7.0)
Neutrophils: 45 %
Platelets: 240 10*3/uL (ref 150–450)
RBC: 4.74 x10E6/uL (ref 4.14–5.80)
RDW: 13.4 % (ref 11.6–15.4)
WBC: 5.1 10*3/uL (ref 3.4–10.8)

## 2019-05-09 LAB — B12 AND FOLATE PANEL
Folate: 14.3 ng/mL (ref >3.0)
Vitamin B-12: 781 pg/mL (ref 232–1245)

## 2019-05-09 LAB — LIPID PANEL
Chol/HDL Ratio: 3.7 ratio (ref 0.0–5.0)
Cholesterol, Total: 260 mg/dL — ABNORMAL HIGH (ref 100–199)
HDL: 71 mg/dL (ref >39)
LDL Chol Calc (NIH): 181 mg/dL — ABNORMAL HIGH (ref 0–99)
Triglycerides: 52 mg/dL (ref 0–149)
VLDL Cholesterol Cal: 8 mg/dL (ref 5–40)

## 2019-05-09 LAB — COMPREHENSIVE METABOLIC PANEL
ALT: 28 IU/L (ref 0–44)
AST: 30 IU/L (ref 0–40)
Albumin/Globulin Ratio: 1.5 (ref 1.2–2.2)
Albumin: 4.3 g/dL (ref 3.8–4.9)
Alkaline Phosphatase: 84 IU/L (ref 39–117)
BUN/Creatinine Ratio: 10 (ref 9–20)
BUN: 10 mg/dL (ref 6–24)
Bilirubin Total: 0.3 mg/dL (ref 0.0–1.2)
CO2: 21 mmol/L (ref 20–29)
Calcium: 9.6 mg/dL (ref 8.7–10.2)
Chloride: 103 mmol/L (ref 96–106)
Creatinine, Ser: 0.98 mg/dL (ref 0.76–1.27)
GFR calc Af Amer: 97 mL/min/{1.73_m2} (ref >59)
GFR calc non Af Amer: 84 mL/min/{1.73_m2} (ref >59)
Globulin, Total: 2.8 g/dL (ref 1.5–4.5)
Glucose: 96 mg/dL (ref 65–99)
Potassium: 4 mmol/L (ref 3.5–5.2)
Sodium: 142 mmol/L (ref 134–144)
Total Protein: 7.1 g/dL (ref 6.0–8.5)

## 2019-05-09 LAB — HEMOGLOBIN A1C
Est. average glucose Bld gHb Est-mCnc: 108 mg/dL
Hgb A1c MFr Bld: 5.4 % (ref 4.8–5.6)

## 2019-05-09 LAB — VITAMIN D 25 HYDROXY (VIT D DEFICIENCY, FRACTURES): Vit D, 25-Hydroxy: 30.3 ng/mL (ref 30.0–100.0)

## 2019-05-09 LAB — TSH: TSH: 0.965 u[IU]/mL (ref 0.450–4.500)

## 2019-05-11 ENCOUNTER — Telehealth: Payer: Self-pay

## 2019-05-11 DIAGNOSIS — E7800 Pure hypercholesterolemia, unspecified: Secondary | ICD-10-CM

## 2019-05-11 DIAGNOSIS — I10 Essential (primary) hypertension: Secondary | ICD-10-CM

## 2019-05-11 MED ORDER — PRAVASTATIN SODIUM 40 MG PO TABS
40.0000 mg | ORAL_TABLET | Freq: Every day | ORAL | 3 refills | Status: DC
Start: 2019-05-11 — End: 2019-10-27

## 2019-05-11 MED ORDER — AMLODIPINE BESYLATE 5 MG PO TABS: 5.0000 mg | ORAL_TABLET | Freq: Every day | ORAL | 3 refills | Status: DC

## 2019-05-11 NOTE — Telephone Encounter (Signed)
Patient advised as directed below. 

## 2019-05-11 NOTE — Telephone Encounter (Signed)
LMTCB 05/11/2019  Thanks,   -Laura  

## 2019-05-11 NOTE — Telephone Encounter (Signed)
Sent in Pravastatin 40mg  for cholesterol. Also sent in Amlodipine 5mg  for HTN. If BP drops too low, cut this in half.

## 2019-05-11 NOTE — Telephone Encounter (Signed)
-----   Message from Mar Daring, [place] sent at 05/11/2019  8:34 AM EDT ----- All labs are normal and stable with exception of cholesterol. It is stable but is elevated. I would recommend a cholesterol lowering medication to help decrease risk of cardiovascular events. You have previously been on Pravastatin in the past. We could restart that to help.

## 2019-05-11 NOTE — Telephone Encounter (Signed)
Pt advised.  He would like to restart Pravastatin.  Please send to [facility] in [place].   He also mentioned that he has been checking is blood pressure at home.  He generally gets 120's/90-92.  He did get one reading where is bottom number was 101.  He wanted to know if he needed to start BP medicine.   Please advise.   Thanks,   -Mickel Baas

## 2019-09-30 ENCOUNTER — Ambulatory Visit (INDEPENDENT_AMBULATORY_CARE_PROVIDER_SITE_OTHER): Payer: 59 | Admitting: Physician Assistant

## 2019-09-30 ENCOUNTER — Encounter: Payer: Self-pay | Admitting: Physician Assistant

## 2019-09-30 VITALS — BP 150/90 | Temp 97.1°F | Wt 183.4 lb

## 2019-09-30 DIAGNOSIS — M5030 Other cervical disc degeneration, unspecified cervical region: Secondary | ICD-10-CM | POA: Diagnosis not present

## 2019-09-30 DIAGNOSIS — I10 Essential (primary) hypertension: Secondary | ICD-10-CM

## 2019-09-30 MED ORDER — MELOXICAM 7.5 MG PO TABS: 7.5000 mg | ORAL_TABLET | Freq: Every day | ORAL | 0 refills | Status: DC

## 2019-09-30 MED ORDER — CYCLOBENZAPRINE HCL 5 MG PO TABS: 5.0000 mg | ORAL_TABLET | Freq: Three times a day (TID) | ORAL | 1 refills | Status: DC | PRN

## 2019-09-30 MED ORDER — AMLODIPINE BESYLATE 10 MG PO TABS: 10.0000 mg | ORAL_TABLET | Freq: Every day | ORAL | 0 refills | Status: DC

## 2019-09-30 NOTE — Progress Notes (Signed)
Patient: Grant Golden Male    DOB: 1961-04-25   59 y.o.   MRN: [id number] Visit Date: 09/30/2019  Today's Provider: Trey Sailors, PA-C   Chief Complaint  Patient presents with  . Neck Pain   Subjective:     Neck Pain  This is a recurrent problem. The current episode started more than 1 month ago. The problem occurs intermittently. The problem has been gradually worsening. The pain is associated with a sleep position. The quality of the pain is described as aching. The pain is at a severity of 9/10. The pain is moderate. The symptoms are aggravated by position. The pain is worse during the night. Stiffness is present in the morning. Pertinent negatives include no numbness, pain with swallowing, tingling or weakness. He has tried neck support for the symptoms.   Patient reports pain is worst in the morning and slowly improves throughout the day. He underwent CT scan of neck for similar pain in 2000, the findings of which are listed below. He reports at that time he was sent to physical therapy and his problems resolved.     FINDINGS CLINICAL DATA:   NECK AND LEFT UPPER EXTREMITY PAIN. CERVICAL MYELOGRAM: DR. Barnett Abu INSTILLED AN APPROPRIATE AMOUNT OF OMNIPAQUE 300 INTO THE LUMBAR SUBARACHNOID SPACE.   I MANEUVERED THE CONTRAST MATERIAL INTO THE CERVICAL REGION WITH THE PATIENT IN THE HEAD DOWN POSITION.   THE TABLE WAS LEVELED AND ROUTINE MYELOGRAPHIC FILMS WERE ACQUIRED. THERE IS AN ANTERIOR EXTRADURAL DEFECT AT C2-3 SECONDARY TO POSTERIOR VERTEBRAL BODY OSTEOPHYTIC FORMATION.   ANNULAR BULGING OR DISK PROTRUSION MAY BE PRESENT AS WELL.  THIS WILL BE FURTHER EVALUATED WITH CT. 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  SUPERIMPOSED DEGENERATIVE CHANGES ACCENTUATE THE NARROWING.   THERE IS POOR FILLING OF THE LEFT C7 AXILLARY NERVE ROOT SLEEVE.  THIS MAY BE DUE TO SPONDYLOSIS OR AN HNP.   AGAIN CT WILL BE HELPFUL.  THERE IS DISK SPACE  NARROWING AND VERTEBRAL BODY OSTEOPHYTIC FORMATION MAINLY VENTRALLY AT C5-6 SECONDARY TO DEGENERATIVE DISK DISEASE.   ANTERIOR EXTRADURAL DEFECTS AT C5-6 AND C6-7 ARE SECONDARY TO BONY HYPERTROPHY OF THE VERTEBRAL BODIES. IMPRESSION SOME EFFACEMENT OF THE LEFT C7 AXILLARY NERVE ROOT SLEEVE.   MINIMAL EFFACEMENT OF THE C6 AXILLARY NERVE ROOT SLEEVES.   THE FINDINGS MAY BE DUE TO SPONDYLOSIS AS OPPOSED TO HNP. ANTERIOR EXTRADURAL DEFECTS AT C2-3, C5-6, AND C6-7. POST-MYELOGRAM CT SCAN OF THE CERVICAL SPINE: AXIAL CUTS WERE OBTAINED FROM C2 TO T1.  THERE IS VERTEBRAL BODY OSTEOPHYTIC FORMATION POSTERIORLY ON THE RIGHT AND LEFT, MAINLY ON THE LEFT, AND ALSO A LEFT PARACENTRAL DISK PROTRUSION AT C2-3. THERE IS ALSO BIFORAMINAL STENOSIS SECONDARY TO UNCINATE PROCESS HYPERTROPHY. C3-4:      THERE IS MILD FORAMINAL STENOSIS ON THE RIGHT.  NO HNP. C4-5:      THERE IS MILD CENTRAL POSTERIOR ANNULAR BULGING BUT NO HNP AND NO STENOSIS. C5-6:      AS NOTED AT OTHER LEVELS, THERE IS SOME FACET HYPERTROPHY.  THERE IS POSTERIOR VERTEBRAL BODY OSTEOPHYTIC FORMATION.  THIS ACCOUNTS FOR THE ANTERIOR EXTRADURAL DEFECT NOTED MYELOGRAPHICALLY. C6-7:      THERE IS ANNULAR BULGING AND MILD VERTEBRAL BODY OSTEOPHYTIC FORMATION BUT NO EVIDENCE OF AN HNP.   THERE IS MILD FORAMINAL STENOSIS ON THE LEFT.   THE RELATIVELY POOR CONTRAST FILLING OF THE LEFT C7 AXILLARY NERVE ROOT SLEEVE IS PROBABLY THE RESULT OF SPONDYLOSIS AS I  DETECT NO EVIDENCE OF A DISK HERNIATION ON THE CT. C7-T1:   NO HNP OR STENOSIS. 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 MILD TO MODERATE BIFORAMINAL STENOSIS AND DEGENERATIVE DISK DISEASE AT C5-6. MILD FORAMINAL STENOSIS AT C6-7.   NO HNP AT C6-7. POST-MYELOGRAM CT MULTIPLANAR RECONSTRUCTION OF THE CERVICAL SPINE: FROM THE THIN SECTION AXIAL CUTS, SAGITTAL IMAGES WERE ACQUIRED.   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   THERE IS NO  CORD COMPRESSION.  THERE IS A LEFT PARACENTRAL DISK PROTRUSION AT C2-3.   THERE IS DEGENERATIVE DISEASE AT C5-6 AND DIFFUSE DEGENERATIVE FACET ARTHROPATHY.  HTN: Currently taking amlodipine 5 mg daily.  BP Readings from Last 3 Encounters:  09/30/19 (!) 150/90  05/05/19 (!) 135/93  12/13/18 (!) 133/98    No Known Allergies   Current Outpatient Medications:  .  albuterol (VENTOLIN HFA) 108 (90 Base) MCG/ACT inhaler, Inhale 2 puffs into the lungs every 6 (six) hours as needed for wheezing or shortness of breath., Disp: 18 g, Rfl: 1 .  amLODipine (NORVASC) 5 MG tablet, Take 1 tablet (5 mg total) by mouth daily., Disp: 90 tablet, Rfl: 3 .  pravastatin (PRAVACHOL) 40 MG tablet, Take 1 tablet (40 mg total) by mouth daily., Disp: 90 tablet, Rfl: 3  Review of Systems  Constitutional: Negative.   HENT: Negative.   Respiratory: Negative.   Cardiovascular: Negative.   Musculoskeletal: Positive for neck pain.  Neurological: Negative.  Negative for tingling, weakness and numbness.  Hematological: Negative.     Social History   Tobacco Use  . Smoking status: Former Smoker    Types: Cigarettes  . Smokeless tobacco: Never Used  . Tobacco comment: Quit smoking back in 2001  Substance Use Topics  . Alcohol use: Yes    Alcohol/week: 0.0 standard drinks      Objective:   BP (!) 150/90 (BP Location: Right Arm, Patient Position: Sitting, Cuff Size: Normal)   Temp (!) 97.1 F (36.2 C) (Temporal)   Wt 183 lb 6.4 oz (83.2 kg)   BMI 28.72 kg/m  Vitals:   09/30/19 0912  BP: (!) 150/90  Temp: (!) 97.1 F (36.2 C)  TempSrc: Temporal  Weight: 183 lb 6.4 oz (83.2 kg)  Body mass index is 28.72 kg/m.   Physical Exam Constitutional:      Appearance: Normal appearance.  Cardiovascular:     Rate and Rhythm: Normal rate and regular rhythm.     Heart sounds: Normal heart sounds.  Pulmonary:     Effort: Pulmonary effort is normal.     Breath sounds: Normal breath sounds.   Musculoskeletal:     Cervical back: Full passive range of motion without pain, normal range of motion and neck supple. No signs of trauma or crepitus. Muscular tenderness present. No spinous process tenderness. Normal range of motion.  Skin:    General: Skin is warm and dry.  Neurological:     Mental Status: He is alert and oriented to person, place, and time. Mental status is at baseline.  Psychiatric:        Mood and Affect: Mood normal.        Behavior: Behavior normal.      No results found for any visits on 09/30/19.     Assessment & Plan    1. DDD (degenerative disc disease), cervical  Neck pain likely multifactorial with DDD contributing as well as muscular dysfunction. Will start treatment as below and counseled on spectrum  of treatment including PT, injections, and surgery. Patient knows to call back if not improving and we will likely have him see an orthopedist.  - meloxicam (MOBIC) 7.5 MG tablet; Take 1 tablet (7.5 mg total) by mouth daily.  Dispense: 30 tablet; Refill: 0 - cyclobenzaprine (FLEXERIL) 5 MG tablet; Take 1 tablet (5 mg total) by mouth 3 (three) times daily as needed for muscle spasms.  Dispense: 30 tablet; Refill: 1  2. Essential hypertension  Increase amlodipine to 10 mg QD.   - amLODipine (NORVASC) 10 MG tablet; Take 1 tablet (10 mg total) by mouth daily.  Dispense: 90 tablet; Refill: 0  The entirety of the information documented in the History of Present Illness, Review of Systems and Physical Exam were personally obtained by me. Portions of this information were initially documented by [facility] and reviewed by me for thoroughness and accuracy.      Trey Sailors, PA-C  [facility]

## 2019-09-30 NOTE — Patient Instructions (Signed)
Arthritis Arthritis means joint pain. It can also mean joint disease. A joint is a place where bones come together. There are more than 100 types of arthritis. What are the causes? This condition may be caused by:  Wear and tear of a joint. This is the most common cause.  A lot of acid in the blood, which leads to pain in the joint (gout).  Pain and swelling (inflammation) in a joint.  Infection of a joint.  Injuries in the joint.  A reaction to medicines (allergy). In some cases, the cause may not be known. What are the signs or symptoms? Symptoms of this condition include:  Redness at a joint.  Swelling at a joint.  Stiffness at a joint.  Warmth coming from the joint.  A fever.  A feeling of being sick. How is this treated? This condition may be treated with:  Treating the cause, if it is known.  Rest.  Raising (elevating) the joint.  Putting cold or hot packs on the joint.  Medicines to treat symptoms and reduce pain and swelling.  Shots of medicines (cortisone) into the joint. You may also be told to make changes in your life, such as doing exercises and losing weight. Follow these instructions at home: Medicines  Take over-the-counter and prescription medicines only as told by your doctor.  Do not take aspirin for pain if your doctor says that you may have gout. Activity  Rest your joint if your doctor tells you to.  Avoid activities that make the pain worse.  Exercise your joint regularly as told by your doctor. Try doing exercises like: ? Swimming. ? Water aerobics. ? Biking. ? Walking. Managing pain, stiffness, and swelling      If told, put ice on the affected area. ? Put ice in a plastic bag. ? Place a towel between your skin and the bag. ? Leave the ice on for 20 minutes, 2-3 times per day.  If your joint is swollen, raise (elevate) it above the level of your heart if told by your doctor.  If your joint feels stiff in the morning,  try taking a warm shower.  If told, put heat on the affected area. Do this as often as told by your doctor. Use the heat source that your doctor recommends, such as a moist heat pack or a heating pad. If you have diabetes, do not apply heat without asking your doctor. To apply heat: ? Place a towel between your skin and the heat source. ? Leave the heat on for 20-30 minutes. ? Remove the heat if your skin turns bright red. This is very important if you are unable to feel pain, heat, or cold. You may have a greater risk of getting burned. General instructions  Do not use any products that contain nicotine or tobacco, such as cigarettes, e-cigarettes, and chewing tobacco. If you need help quitting, ask your doctor.  Keep all follow-up visits as told by your doctor. This is important. Contact a doctor if:  The pain gets worse.  You have a fever. Get help right away if:  You have very bad pain in your joint.  You have swelling in your joint.  Your joint is red.  Many joints become painful and swollen.  You have very bad back pain.  Your leg is very weak.  You cannot control your pee (urine) or poop (stool). Summary  Arthritis means joint pain. It can also mean joint disease. A joint is a place   where bones come together.  The most common cause of this condition is wear and tear of a joint.  Symptoms of this condition include redness, swelling, or stiffness of the joint.  This condition is treated with rest, raising the joint, medicines, and putting cold or hot packs on the joint.  Follow your doctor's instructions about medicines, activity, exercises, and other home care treatments. This information is not intended to replace advice given to you by your health care provider. Make sure you discuss any questions you have with your health care provider. Document Revised: 07/14/2018 Document Reviewed: 07/14/2018 Elsevier Patient Education  2020 [employer]  

## 2019-10-27 ENCOUNTER — Encounter: Payer: Self-pay | Admitting: Physician Assistant

## 2019-10-27 ENCOUNTER — Other Ambulatory Visit: Payer: Self-pay

## 2019-10-27 ENCOUNTER — Ambulatory Visit (INDEPENDENT_AMBULATORY_CARE_PROVIDER_SITE_OTHER): Payer: 59 | Admitting: Physician Assistant

## 2019-10-27 VITALS — BP 138/78 | Temp 96.8°F | Wt 186.8 lb

## 2019-10-27 DIAGNOSIS — M5030 Other cervical disc degeneration, unspecified cervical region: Secondary | ICD-10-CM | POA: Diagnosis not present

## 2019-10-27 DIAGNOSIS — E7800 Pure hypercholesterolemia, unspecified: Secondary | ICD-10-CM

## 2019-10-27 DIAGNOSIS — M4802 Spinal stenosis, cervical region: Secondary | ICD-10-CM | POA: Diagnosis not present

## 2019-10-27 DIAGNOSIS — I10 Essential (primary) hypertension: Secondary | ICD-10-CM

## 2019-10-27 MED ORDER — PRAVASTATIN SODIUM 40 MG PO TABS: 40.0000 mg | ORAL_TABLET | Freq: Every day | ORAL | 3 refills | Status: DC

## 2019-10-27 NOTE — Patient Instructions (Signed)
Degenerative Disk Disease  Degenerative disk disease is a condition caused by changes that occur in the spinal disks as a person ages. Spinal disks are soft and compressible disks located between the bones of your spine (vertebrae). These disks act like shock absorbers. Degenerative disk disease can affect the whole spine. However, the neck and lower back are most often affected. Many changes can occur in the spinal disks with aging, such as:  The spinal disks may dry and shrink.  Small tears may occur in the tough, outer covering of the disk (annulus).  The disk space may become smaller due to loss of water.  Abnormal growths in the bone (spurs) may occur. This can put pressure on the nerve roots exiting the spinal canal, causing pain.  The spinal canal may become narrowed. What are the causes? This condition may be caused by:  Normal degeneration with age.  Injuries.  Certain activities and sports that cause damage. What increases the risk? The following factors may make you more likely to develop this condition:  Being overweight.  Having a family history of degenerative disk disease.  Smoking.  Sudden injury.  Doing work that requires heavy lifting. What are the signs or symptoms? Symptoms of this condition include:  Pain that varies in intensity. Some people have no pain, while others have severe pain. The location of the pain depends on the part of your backbone that is affected. You may have: ? Pain in your neck or arm if a disk in your neck area is affected. ? Pain in your back, buttocks, or legs if a disk in your lower back is affected.  Pain that becomes worse while bending or reaching up, or with twisting movements.  Pain that may start gradually and then get worse as time passes. It may also start after a major or minor injury.  Numbness or tingling in the arms or legs. How is this diagnosed? This condition may be diagnosed based on:  Your symptoms and  medical history.  A physical exam.  Imaging tests, including: ? An X-ray of the spine. ? MRI. How is this treated? This condition may be treated with:  Medicines.  Rehabilitation exercises. These activities aim to strengthen muscles in your back and abdomen to better support your spine. If treatments do not help to relieve your symptoms or you have severe pain, you may need surgery. Follow these instructions at home: Medicines  Take over-the-counter and prescription medicines only as told by your health care provider.  Do not drive or use heavy machinery while taking prescription pain medicine.  If you are taking prescription pain medicine, take actions to prevent or treat constipation. Your health care provider may recommend that you: ? Drink enough fluid to keep your urine pale yellow. ? Eat foods that are high in fiber, such as fresh fruits and vegetables, whole grains, and beans. ? Limit foods that are high in fat and processed sugars, such as fried or sweet foods. ? Take an over-the-counter or prescription medicine for constipation. Activity  Rest as told by your health care provider.  Ask your health care provider what activities are safe for you. Return to your normal activities as directed.  Avoid sitting for a long time without moving. Get up to take short walks every 1-2 hours. This is important to improve blood flow and breathing. Ask for help if you feel weak or unsteady.  Perform relaxation exercises as told by your health care provider.  Maintain good posture.    Do not lift anything that is heavier than 10 lb (4.5 kg), or the limit that you are told, until your health care provider says that it is safe.  Follow proper lifting and walking techniques as told by your health care provider. Managing pain, stiffness, and swelling   If directed, put ice on the painful area. Icing can help to relieve pain. ? Put ice in a plastic bag. ? Place a towel between your  skin and the bag. ? Leave the ice on for 20 minutes, 2-3 times a day.  If directed, apply heat to the painful area as often as told by your health care provider. Heat can reduce the stiffness of your muscles. Use the heat source that your health care provider recommends, such as a moist heat pack or a heating pad. ? Place a towel between your skin and the heat source. ? Leave the heat on for 20-30 minutes. ? Remove the heat if your skin turns bright red. This is especially important if you are unable to feel pain, heat, or cold. You may have a greater risk of getting burned. General instructions  Change your sitting, standing, and sleeping habits as told by your health care provider.  Avoid sitting in the same position for long periods of time. Change positions frequently.  Lose weight or maintain a healthy weight as told by your health care provider.  Do not use any products that contain nicotine or tobacco, such as cigarettes and e-cigarettes. If you need help quitting, ask your health care provider.  Wear supportive footwear.  Keep all follow-up visits as told by your health care provider. This is important. This may include visits for physical therapy. Contact a health care provider if you:  Have pain that does not go away within 1-4 weeks.  Lose your appetite.  Lose weight without trying. Get help right away if you:  Have severe pain.  Notice weakness in your arms, hands, or legs.  Begin to lose control of your bladder or bowel movements.  Have fevers or night sweats. Summary  Degenerative disk disease is a condition caused by changes that occur in the spinal disks as a person ages.  Degenerative disk disease can affect the whole spine. However, the neck and lower back are most often affected.  Take over-the-counter and prescription medicines only as told by your health care provider. This information is not intended to replace advice given to you by your health care  provider. Make sure you discuss any questions you have with your health care provider. Document Revised: 08/01/2017 Document Reviewed: 08/01/2017 Elsevier Patient Education  2020 Elsevier Inc.  

## 2019-10-27 NOTE — Progress Notes (Signed)
Patient: Grant Golden Male    DOB: 1961/04/22   59 y.o.   MRN: [id number] Visit Date: 10/27/2019  Today's Provider: Trey Sailors, PA-C   Chief Complaint  Patient presents with  . Neck Pain   Subjective:     Neck Pain  This is a recurrent problem. The current episode started more than 1 month ago. The problem has been unchanged. The pain is present in the right side. The quality of the pain is described as aching (more at night). The pain is moderate. Stiffness is present in the morning (and pain.). He has tried NSAIDs for the symptoms. The treatment provided mild relief.  Patient states that the pain is moving from neck to shoulder especially at night. Patient states in the morning that the shoulder is swollen within the neck and shoulder.  Patient also states that the pain also occurs when pressure on the arm.  At last appointment, Patient states that he was given cyclobenzaprine but is not working to relieve the pain.   HTN: Last month patient had his amlodipine 5 mg QD increased to amlodipine 10 mg QD. He reports he is taking this without issue. He says two weeks ago when he went to the dentist they took his blood pressure and told him it was high but did not tell him the exact reading. Denies chest pain and SOB.   BP Readings from Last 6 Encounters:  10/27/19 138/78  09/30/19 (!) 150/90  05/05/19 (!) 135/93  12/13/18 (!) 133/98  10/03/18 136/82  09/13/17 140/85    No Known Allergies   Current Outpatient Medications:  .  albuterol (VENTOLIN HFA) 108 (90 Base) MCG/ACT inhaler, Inhale 2 puffs into the lungs every 6 (six) hours as needed for wheezing or shortness of breath., Disp: 18 g, Rfl: 1 .  amLODipine (NORVASC) 10 MG tablet, Take 1 tablet (10 mg total) by mouth daily., Disp: 90 tablet, Rfl: 0 .  cyclobenzaprine (FLEXERIL) 5 MG tablet, Take 1 tablet (5 mg total) by mouth 3 (three) times daily as needed for muscle spasms., Disp: 30 tablet, Rfl: 1 .   meloxicam (MOBIC) 7.5 MG tablet, Take 1 tablet (7.5 mg total) by mouth daily., Disp: 30 tablet, Rfl: 0 .  pravastatin (PRAVACHOL) 40 MG tablet, Take 1 tablet (40 mg total) by mouth daily., Disp: 90 tablet, Rfl: 3  Review of Systems  Musculoskeletal: Positive for neck pain.    Social History   Tobacco Use  . Smoking status: Former Smoker    Types: Cigarettes  . Smokeless tobacco: Never Used  . Tobacco comment: Quit smoking back in 2001  Substance Use Topics  . Alcohol use: Yes    Alcohol/week: 0.0 standard drinks      Objective:   BP 138/78 (BP Location: Left Arm, Patient Position: Sitting, Cuff Size: Normal)   Temp (!) 96.8 F (36 C) (Temporal)   Wt 186 lb 12.8 oz (84.7 kg)   BMI 29.26 kg/m  Vitals:   10/27/19 1119  BP: 138/78  Temp: (!) 96.8 F (36 C)  TempSrc: Temporal  Weight: 186 lb 12.8 oz (84.7 kg)  Body mass index is 29.26 kg/m.   Physical Exam Constitutional:      Appearance: Normal appearance.  Cardiovascular:     Rate and Rhythm: Normal rate and regular rhythm.     Heart sounds: Normal heart sounds.  Pulmonary:     Effort: Pulmonary effort is normal.     Breath sounds:  Normal breath sounds.  Skin:    General: Skin is warm and dry.  Neurological:     Mental Status: He is alert and oriented to person, place, and time. Mental status is at baseline.  Psychiatric:        Mood and Affect: Mood normal.        Behavior: Behavior normal.      No results found for any visits on 10/27/19.     Assessment & Plan    1. Cervical spinal stenosis  Will refer to orthopedics for further evaluation and treatment. Discussed potential treatments including steroid injections, nerve ablations, potential surgery.   - Ambulatory referral to Orthopedics  2. DDD (degenerative disc disease), cervical  - Ambulatory referral to Orthopedics  3. Hypercholesterolemia  - pravastatin (PRAVACHOL) 40 MG tablet; Take 1 tablet (40 mg total) by mouth daily.  Dispense: 90  tablet; Refill: 3  4. Essential HTN  Improved with increase to 10 mg amlodipine. Still borderline high. Follow up at 6 months and if high then consider adding medications.   The entirety of the information documented in the History of Present Illness, Review of Systems and Physical Exam were personally obtained by me. Portions of this information were initially documented by [facility] and reviewed by me for thoroughness and accuracy.      Trinna Post, PA-C  [facility]

## 2019-11-05 ENCOUNTER — Ambulatory Visit: Payer: Self-pay | Attending: Internal Medicine

## 2019-11-05 DIAGNOSIS — Z23 Encounter for immunization: Secondary | ICD-10-CM

## 2019-11-05 NOTE — Progress Notes (Signed)
   Covid-19 Vaccination Clinic  Name:  Grant Golden    MRN: [id number] DOB: August 26, 1960  11/05/2019  Grant Golden was observed post Covid-19 immunization for 15 minutes without incident. He was provided with Vaccine Information Sheet and instruction to access the V-Safe system.   Grant Golden was instructed to call 911 with any severe reactions post vaccine: Marland Kitchen Difficulty breathing  . Swelling of face and throat  . A fast heartbeat  . A bad rash all over body  . Dizziness and weakness   Immunizations Administered    Name Date Dose VIS Date Route   Pfizer COVID-19 Vaccine 11/05/2019  8:15 AM 0.3 mL 07/31/2019 Intramuscular   Manufacturer: [employer], [employer]   Lot: [id number]   NDC: [address]-3

## 2019-11-24 ENCOUNTER — Ambulatory Visit: Payer: Self-pay | Attending: Internal Medicine

## 2019-11-24 DIAGNOSIS — Z23 Encounter for immunization: Secondary | ICD-10-CM

## 2019-11-24 NOTE — Progress Notes (Signed)
   Covid-19 Vaccination Clinic  Name:  Terryl Molinelli    MRN: [id number] DOB: December 05, 1960  11/24/2019  Mr. Doolittle was observed post Covid-19 immunization for 15 minutes without incident. He was provided with Vaccine Information Sheet and instruction to access the V-Safe system.   Mr. Follette was instructed to call 911 with any severe reactions post vaccine: Marland Kitchen Difficulty breathing  . Swelling of face and throat  . A fast heartbeat  . A bad rash all over body  . Dizziness and weakness   Immunizations Administered    Name Date Dose VIS Date Route   Pfizer COVID-19 Vaccine 11/24/2019  2:59 PM 0.3 mL 07/31/2019 Intramuscular   Manufacturer: [employer], [employer]   Lot: [id number]   NDC: [address]-4

## 2019-12-02 DIAGNOSIS — M5030 Other cervical disc degeneration, unspecified cervical region: Secondary | ICD-10-CM | POA: Diagnosis not present

## 2019-12-02 DIAGNOSIS — S161XXA Strain of muscle, fascia and tendon at neck level, initial encounter: Secondary | ICD-10-CM | POA: Diagnosis not present

## 2019-12-02 DIAGNOSIS — M542 Cervicalgia: Secondary | ICD-10-CM | POA: Diagnosis not present

## 2019-12-02 DIAGNOSIS — M47812 Spondylosis without myelopathy or radiculopathy, cervical region: Secondary | ICD-10-CM | POA: Diagnosis not present

## 2019-12-10 DIAGNOSIS — M47812 Spondylosis without myelopathy or radiculopathy, cervical region: Secondary | ICD-10-CM | POA: Diagnosis not present

## 2019-12-10 DIAGNOSIS — M6281 Muscle weakness (generalized): Secondary | ICD-10-CM | POA: Diagnosis not present

## 2019-12-30 ENCOUNTER — Encounter: Payer: Self-pay | Admitting: Physician Assistant

## 2019-12-30 ENCOUNTER — Ambulatory Visit (INDEPENDENT_AMBULATORY_CARE_PROVIDER_SITE_OTHER): Payer: BC Managed Care – PPO | Admitting: Physician Assistant

## 2019-12-30 ENCOUNTER — Other Ambulatory Visit: Payer: Self-pay

## 2019-12-30 VITALS — BP 154/96 | HR 89 | Temp 96.9°F | Wt 184.6 lb

## 2019-12-30 DIAGNOSIS — M542 Cervicalgia: Secondary | ICD-10-CM

## 2019-12-30 MED ORDER — MELOXICAM 7.5 MG PO TABS: 7.5000 mg | ORAL_TABLET | Freq: Every day | ORAL | 0 refills | Status: DC

## 2019-12-30 NOTE — Progress Notes (Signed)
Established patient visit   Patient: Grant Golden   DOB: 09-15-1960   60 y.o. Male  MRN: [id number] Visit Date: 12/30/2019  Today's healthcare provider: Trinna Post, PA-C   Chief Complaint  Patient presents with  . Neck Pain   I,Latasha Walston,acting as a scribe for Trinna Post, PA-C.,have documented all relevant documentation on the behalf of Trinna Post, PA-C,as directed by  Trinna Post, PA-C while in the presence of Trinna Post, PA-C.  Subjective    Neck Pain  This is a new problem. Episode onset: Friday. The problem occurs intermittently. The problem has been gradually worsening. The pain is associated with a twisting injury. The pain is present in the left side. The quality of the pain is described as aching. The symptoms are aggravated by coughing and twisting. Pertinent negatives include no numbness, tingling or weakness. He has tried chiropractic manipulation (Advil) for the symptoms. The treatment provided moderate relief.  Patient has been seeing Ortho and doing PT for cervical spondylosis. Recommendation to refer to neurosurgery for injections/surgery if symptoms worsen.       Medications: Outpatient Medications Prior to Visit  Medication Sig  . albuterol (VENTOLIN HFA) 108 (90 Base) MCG/ACT inhaler Inhale 2 puffs into the lungs every 6 (six) hours as needed for wheezing or shortness of breath.  Marland Kitchen amLODipine (NORVASC) 10 MG tablet Take 1 tablet (10 mg total) by mouth daily.  . cyclobenzaprine (FLEXERIL) 5 MG tablet Take 1 tablet (5 mg total) by mouth 3 (three) times daily as needed for muscle spasms.  . pravastatin (PRAVACHOL) 40 MG tablet Take 1 tablet (40 mg total) by mouth daily.  . [DISCONTINUED] meloxicam (MOBIC) 7.5 MG tablet Take 1 tablet (7.5 mg total) by mouth daily.   No facility-administered medications prior to visit.    Review of Systems  Constitutional: Negative.   Respiratory: Negative.   Cardiovascular: Negative.    Musculoskeletal: Positive for myalgias and neck pain.  Neurological: Negative for tingling, weakness and numbness.      Objective    BP (!) 154/96 (BP Location: Left Arm, Patient Position: Sitting, Cuff Size: Large)   Pulse 89   Temp (!) 96.9 F (36.1 C) (Temporal)   Wt 184 lb 9.6 oz (83.7 kg)   BMI 28.91 kg/m    Physical Exam Constitutional:      Appearance: Normal appearance.  Neck:     Vascular: No carotid bruit.     Comments: Full ROM- pain with extension  Cardiovascular:     Rate and Rhythm: Normal rate and regular rhythm.     Pulses: Normal pulses.     Heart sounds: Normal heart sounds.  Pulmonary:     Effort: Pulmonary effort is normal.     Breath sounds: Normal breath sounds.  Musculoskeletal:     Cervical back: No tenderness. Pain with movement present.  Skin:    General: Skin is warm and dry.  Neurological:     General: No focal deficit present.     Mental Status: He is alert and oriented to person, place, and time.  Psychiatric:        Mood and Affect: Mood normal.        Behavior: Behavior normal.      No results found for any visits on 12/30/19.  Assessment & Plan    1. Neck pain on left side This is most likely muscular pain, will try Meloxicam 7.5 mg. If not improving let us  know. Need to contact Cranston Neighbor at [facility] to follow up per last office note with him.   - meloxicam (MOBIC) 7.5 MG tablet; Take 1 tablet (7.5 mg total) by mouth daily.  Dispense: 30 tablet; Refill: 0    Return if symptoms worsen or fail to improve.      ITrey Sailors, PA-C, have reviewed all documentation for this visit. The documentation on 12/30/19 for the exam, diagnosis, procedures, and orders are all accurate and complete.    Maryella Shivers  [facility] [phone] (phone) [phone] (fax)  [facility]

## 2020-02-07 ENCOUNTER — Other Ambulatory Visit: Payer: Self-pay | Admitting: Physician Assistant

## 2020-02-07 DIAGNOSIS — M542 Cervicalgia: Secondary | ICD-10-CM

## 2020-02-12 ENCOUNTER — Other Ambulatory Visit: Payer: Self-pay | Admitting: Physician Assistant

## 2020-02-12 DIAGNOSIS — E7800 Pure hypercholesterolemia, unspecified: Secondary | ICD-10-CM

## 2020-02-12 DIAGNOSIS — I10 Essential (primary) hypertension: Secondary | ICD-10-CM

## 2020-02-12 MED ORDER — AMLODIPINE BESYLATE 10 MG PO TABS: 10.0000 mg | ORAL_TABLET | Freq: Every day | ORAL | 0 refills | Status: DC

## 2020-02-12 MED ORDER — PRAVASTATIN SODIUM 40 MG PO TABS: 40.0000 mg | ORAL_TABLET | Freq: Every day | ORAL | 0 refills | Status: DC

## 2020-02-12 NOTE — Telephone Encounter (Signed)
Medication Refill - Medication: pravastatin (PRAVACHOL) 40 MG tablet Has refills at [facility] but has changed insurance and needs them sent to [facility]   amLODipine (NORVASC) 10 MG tablet Please advise Pt when they have been sent    Has the patient contacted their pharmacy? No. (Agent: If no, request that the patient contact the pharmacy for the refill.) (Agent: If yes, when and what did the pharmacy advise?)  Preferred Pharmacy (with phone number or street name):  [id number] [place], [place] - [address] AT [facility] [address] [facility] Phone:  [phone]  Fax:  [phone]       Agent: Please be advised that RX refills may take up to 3 business days. We ask that you follow-up with your pharmacy.

## 2020-03-09 ENCOUNTER — Other Ambulatory Visit: Payer: Self-pay | Admitting: Physician Assistant

## 2020-03-09 DIAGNOSIS — M542 Cervicalgia: Secondary | ICD-10-CM

## 2020-03-09 NOTE — Telephone Encounter (Signed)
Requested Prescriptions  Pending Prescriptions Disp Refills   meloxicam (MOBIC) 7.5 MG tablet [Pharmacy Med Name: MELOXICAM 7.5MG  TABLETS] 90 tablet 0    Sig: TAKE 1 TABLET(7.5 MG) BY MOUTH DAILY     Analgesics:  COX2 Inhibitors Passed - 03/09/2020  3:17 AM      Passed - HGB in normal range and within 360 days    Hemoglobin  Date Value Ref Range Status  05/08/2019 14.0 13.0 - 17.7 g/dL Final         Passed - Cr in normal range and within 360 days    Creatinine, Ser  Date Value Ref Range Status  05/08/2019 0.98 0.76 - 1.27 mg/dL Final         Passed - Patient is not pregnant      Passed - Valid encounter within last 12 months    Recent Outpatient Visits          2 months ago Neck pain on left side   [facility] [place], [place], [place]   4 months ago Cervical spinal stenosis   [facility] Osvaldo Angst M, PA-C   5 months ago DDD (degenerative disc disease), cervical   [facility] Osvaldo Angst M, PA-C   10 months ago Fatigue, unspecified type   [facility], [place], [place]   1 year ago Influenza-like illness   [facility] [place], Lavella Hammock, PA-C      Future Appointments            In 1 month Jodi Marble, Lavella Hammock, PA-C [employer]

## 2020-04-27 NOTE — Progress Notes (Signed)
Established patient visit   Patient: Grant Golden   DOB: 09-Mar-1961   59 y.o. Male  MRN: [id number] Visit Date: 04/28/2020  Today's healthcare provider: Trey Sailors, PA-C   Chief Complaint  Patient presents with  . Hyperlipidemia  . Hypertension  . Leg Pain   Subjective    HPI  Hypertension, follow-up  BP Readings from Last 3 Encounters:  04/28/20 (!) 148/101  12/30/19 (!) 154/96  10/27/19 138/78   Wt Readings from Last 3 Encounters:  04/28/20 179 lb (81.2 kg)  12/30/19 184 lb 9.6 oz (83.7 kg)  10/27/19 186 lb 12.8 oz (84.7 kg)     He was last seen for hypertension 6 months ago.  BP at that visit was 138/78. Management since that visit includes increased Amlodipine to 10 mg. Reports he did not take his medicine today. Reports he checks his BP at home and it is high even with taking the medicine.  He reports good compliance with treatment. He is not having side effects.  He is following a Regular diet. He is not exercising. He does not smoke.  Use of agents associated with hypertension: none.   Outside blood pressures are checked occasionally. Symptoms: No chest pain No chest pressure  No palpitations No syncope  No dyspnea No orthopnea  No paroxysmal nocturnal dyspnea No lower extremity edema   Pertinent labs: Lab Results  Component Value Date   CHOL 260 (H) 05/08/2019   HDL 71 05/08/2019   LDLCALC 181 (H) 05/08/2019   TRIG 52 05/08/2019   CHOLHDL 3.7 05/08/2019   Lab Results  Component Value Date   NA 142 05/08/2019   K 4.0 05/08/2019   CREATININE 0.98 05/08/2019   GFRNONAA 84 05/08/2019   GFRAA 97 05/08/2019   GLUCOSE 96 05/08/2019     The 10-year ASCVD risk score Denman George DC Jr., et al., 2013) is: 16.9%   Lipid/Cholesterol, Follow-up  Last lipid panel Other pertinent labs  Lab Results  Component Value Date   CHOL 260 (H) 05/08/2019   HDL 71 05/08/2019   LDLCALC 181 (H) 05/08/2019   TRIG 52 05/08/2019   CHOLHDL 3.7  05/08/2019   Lab Results  Component Value Date   ALT 28 05/08/2019   AST 30 05/08/2019   PLT 240 05/08/2019   TSH 0.965 05/08/2019     He was last seen for this 6 months ago.  Management since that visit includes continue current medication.  He reports good compliance with treatment. He is not having side effects.   Symptoms: No chest pain No chest pressure/discomfort  No dyspnea No lower extremity edema  No numbness or tingling of extremity No orthopnea  No palpitations No paroxysmal nocturnal dyspnea  No speech difficulty No syncope   Current diet: well balanced Current exercise: none  The 10-year ASCVD risk score Denman George DC Jr., et al., 2013) is: 16.9%    Leg pain Patient reports that he has had a pressure sensation in his right leg  X 1 month. He reports that it is worse at bedtime after he lies down. Reports during the day he does not have this pain. He reports the pain is mild but enough to make him uncomfortable. Denies injuries. No falls. 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       Medications: Outpatient Medications Prior to Visit  Medication Sig  . albuterol (VENTOLIN HFA) 108 (90 Base)  MCG/ACT inhaler Inhale 2 puffs into the lungs every 6 (six) hours as needed for wheezing or shortness of breath.  Marland Kitchen amLODipine (NORVASC) 10 MG tablet Take 1 tablet (10 mg total) by mouth daily.  . cyclobenzaprine (FLEXERIL) 5 MG tablet Take 1 tablet (5 mg total) by mouth 3 (three) times daily as needed for muscle spasms.  . meloxicam (MOBIC) 7.5 MG tablet TAKE 1 TABLET(7.5 MG) BY MOUTH DAILY  . pravastatin (PRAVACHOL) 40 MG tablet Take 1 tablet (40 mg total) by mouth daily.   No facility-administered medications prior to visit.     Review of Systems  Constitutional: Negative.   Respiratory: Negative.   Cardiovascular: Negative for chest pain, palpitations and leg swelling.  Musculoskeletal: Positive for  arthralgias and myalgias.  Skin: Negative.   Neurological: Negative.       Objective    BP (!) 148/101   Pulse (!) 105   Temp 98 F (36.7 C)   Wt 179 lb (81.2 kg)   BMI 28.04 kg/m    Physical Exam Constitutional:      Appearance: Normal appearance.  Cardiovascular:     Rate and Rhythm: Normal rate and regular rhythm.     Pulses: Normal pulses.     Heart sounds: Normal heart sounds.  Pulmonary:     Effort: Pulmonary effort is normal.     Breath sounds: Normal breath sounds.  Skin:    General: Skin is warm and dry.  Neurological:     Mental Status: He is alert and oriented to person, place, and time. Mental status is at baseline.  Psychiatric:        Mood and Affect: Mood normal.        Behavior: Behavior normal.       No results found for any visits on 04/28/20.  Assessment & Plan    1. Essential hypertension  Uncontrolled  Add medication as below in addition to amlodipine 10 mg  Recheck metabolic panel F/u in 1 months for CPE and f/u   - Comprehensive Metabolic Panel (CMET) - Lipid Profile - losartan-hydrochlorothiazide (HYZAAR) 50-12.5 MG tablet; Take 1 tablet by mouth daily.  Dispense: 90 tablet; Refill: 3  2. Hypercholesterolemia Uncontrolled Started statin last visit Repeat FLP and CMP Goal LDL < 100  - Comprehensive Metabolic Panel (CMET) - Lipid Profile - losartan-hydrochlorothiazide (HYZAAR) 50-12.5 MG tablet; Take 1 tablet by mouth daily.  Dispense: 90 tablet; Refill: 3  3. Tetanus   4. Right leg pain  Hip and femur xray, Suspect muscular strain> Recommend tylenol and ibuprofen PRN, heat and ice.     No follow-ups on file.       I, Alton Revere, CMA, have reviewed all documentation for this visit. The documentation on 04/28/20 for the exam, diagnosis, procedures, and orders are all accurate and complete.  The entirety of the information documented in the History of Present Illness, Review of Systems and Physical Exam were  personally obtained by me. Portions of this information were initially documented by Anson [place], CMA and reviewed by me for thoroughness and accuracy.     Maryella Shivers  [facility] [phone] (phone) [phone] (fax)  [facility]

## 2020-04-28 ENCOUNTER — Ambulatory Visit
Admission: RE | Admit: 2020-04-28 | Discharge: 2020-04-28 | Disposition: A | Discharge status: Home / Self Care | Payer: BC Managed Care – PPO | Source: Ambulatory Visit | Attending: Physician Assistant | Admitting: Physician Assistant

## 2020-04-28 ENCOUNTER — Telehealth: Payer: Self-pay

## 2020-04-28 ENCOUNTER — Other Ambulatory Visit: Payer: Self-pay | Admitting: Physician Assistant

## 2020-04-28 ENCOUNTER — Other Ambulatory Visit: Payer: Self-pay

## 2020-04-28 ENCOUNTER — Ambulatory Visit
Admission: RE | Admit: 2020-04-28 | Discharge: 2020-04-28 | Disposition: A | Discharge status: Home / Self Care | Payer: BC Managed Care – PPO | Attending: Physician Assistant | Admitting: Physician Assistant

## 2020-04-28 ENCOUNTER — Ambulatory Visit (INDEPENDENT_AMBULATORY_CARE_PROVIDER_SITE_OTHER): Payer: BC Managed Care – PPO | Admitting: Physician Assistant

## 2020-04-28 VITALS — BP 148/101 | HR 105 | Temp 98.0°F | Wt 179.0 lb

## 2020-04-28 DIAGNOSIS — Z23 Encounter for immunization: Secondary | ICD-10-CM | POA: Diagnosis not present

## 2020-04-28 DIAGNOSIS — E7800 Pure hypercholesterolemia, unspecified: Secondary | ICD-10-CM

## 2020-04-28 DIAGNOSIS — R739 Hyperglycemia, unspecified: Secondary | ICD-10-CM

## 2020-04-28 DIAGNOSIS — M25551 Pain in right hip: Secondary | ICD-10-CM | POA: Diagnosis not present

## 2020-04-28 DIAGNOSIS — I10 Essential (primary) hypertension: Secondary | ICD-10-CM | POA: Diagnosis not present

## 2020-04-28 DIAGNOSIS — M79604 Pain in right leg: Secondary | ICD-10-CM

## 2020-04-28 DIAGNOSIS — A35 Other tetanus: Secondary | ICD-10-CM | POA: Diagnosis not present

## 2020-04-28 DIAGNOSIS — R011 Cardiac murmur, unspecified: Secondary | ICD-10-CM

## 2020-04-28 MED ORDER — LOSARTAN POTASSIUM-HCTZ 50-12.5 MG PO TABS: 1.0000 | ORAL_TABLET | Freq: Every day | ORAL | 3 refills | Status: DC

## 2020-04-28 NOTE — Telephone Encounter (Signed)
Advised patient of results. He is wanting to know is there anything he can take for this if symptoms continue? Please advise. Thanks!

## 2020-04-28 NOTE — Telephone Encounter (Signed)
Patient was advised.  

## 2020-04-28 NOTE — Telephone Encounter (Signed)
Tylenol and ibuprofen

## 2020-04-28 NOTE — Telephone Encounter (Signed)
-----   Message from Trey Sailors, [place] sent at 04/28/2020  1:46 PM EDT ----- Hip and leg xrays do not show any fractures.

## 2020-04-29 ENCOUNTER — Telehealth: Payer: Self-pay

## 2020-04-29 DIAGNOSIS — E7800 Pure hypercholesterolemia, unspecified: Secondary | ICD-10-CM

## 2020-04-29 LAB — CBC WITH DIFFERENTIAL/PLATELET
Basophils Absolute: 0.1 10*3/uL (ref 0.0–0.2)
Basos: 1 %
EOS (ABSOLUTE): 0.4 10*3/uL (ref 0.0–0.4)
Eos: 9 %
Hematocrit: 44.4 % (ref 37.5–51.0)
Hemoglobin: 15.2 g/dL (ref 13.0–17.7)
Immature Grans (Abs): 0 10*3/uL (ref 0.0–0.1)
Immature Granulocytes: 0 %
Lymphocytes Absolute: 1.2 10*3/uL (ref 0.7–3.1)
Lymphs: 28 %
MCH: 29.9 pg (ref 26.6–33.0)
MCHC: 34.2 g/dL (ref 31.5–35.7)
MCV: 87 fL (ref 79–97)
Monocytes Absolute: 0.5 10*3/uL (ref 0.1–0.9)
Monocytes: 12 %
Neutrophils Absolute: 2.3 10*3/uL (ref 1.4–7.0)
Neutrophils: 50 %
Platelets: 259 10*3/uL (ref 150–450)
RBC: 5.09 x10E6/uL (ref 4.14–5.80)
RDW: 13 % (ref 11.6–15.4)
WBC: 4.5 10*3/uL (ref 3.4–10.8)

## 2020-04-29 LAB — LIPID PANEL
Chol/HDL Ratio: 2.3 ratio (ref 0.0–5.0)
Cholesterol, Total: 248 mg/dL — ABNORMAL HIGH (ref 100–199)
HDL: 109 mg/dL (ref >39)
LDL Chol Calc (NIH): 126 mg/dL — ABNORMAL HIGH (ref 0–99)
Triglycerides: 79 mg/dL (ref 0–149)
VLDL Cholesterol Cal: 13 mg/dL (ref 5–40)

## 2020-04-29 LAB — COMPREHENSIVE METABOLIC PANEL
ALT: 36 IU/L (ref 0–44)
AST: 38 IU/L (ref 0–40)
Albumin/Globulin Ratio: 1.4 (ref 1.2–2.2)
Albumin: 4.5 g/dL (ref 3.8–4.9)
Alkaline Phosphatase: 126 IU/L — ABNORMAL HIGH (ref 48–121)
BUN/Creatinine Ratio: 12 (ref 10–24)
BUN: 11 mg/dL (ref 8–27)
Bilirubin Total: 0.4 mg/dL (ref 0.0–1.2)
CO2: 21 mmol/L (ref 20–29)
Calcium: 9.6 mg/dL (ref 8.6–10.2)
Chloride: 104 mmol/L (ref 96–106)
Creatinine, Ser: 0.9 mg/dL (ref 0.76–1.27)
GFR calc Af Amer: 107 mL/min/{1.73_m2} (ref >59)
GFR calc non Af Amer: 93 mL/min/{1.73_m2} (ref >59)
Globulin, Total: 3.3 g/dL (ref 1.5–4.5)
Glucose: 131 mg/dL — ABNORMAL HIGH (ref 65–99)
Potassium: 4 mmol/L (ref 3.5–5.2)
Sodium: 143 mmol/L (ref 134–144)
Total Protein: 7.8 g/dL (ref 6.0–8.5)

## 2020-04-29 LAB — HEMOGLOBIN A1C
Est. average glucose Bld gHb Est-mCnc: 114 mg/dL
Hgb A1c MFr Bld: 5.6 % (ref 4.8–5.6)

## 2020-04-29 MED ORDER — ATORVASTATIN CALCIUM 20 MG PO TABS: 20.0000 mg | ORAL_TABLET | Freq: Every day | ORAL | 0 refills | Status: DC

## 2020-04-29 NOTE — Telephone Encounter (Signed)
Patient was advised and agreed to changing cholesterol medication. Lipitor 20 mg send into pharmacy.

## 2020-04-29 NOTE — Telephone Encounter (Signed)
-----   Message from Trey Sailors, [place] sent at 04/29/2020  1:52 PM EDT ----- Labs are normal except for cholesterol which is uncontrolled on current medication. Recommend changing pravastatin to Lipitor 20 mg QHS #90 and if he is agreeable we can send this in for him. Can recheck at next visit.

## 2020-05-20 ENCOUNTER — Ambulatory Visit (INDEPENDENT_AMBULATORY_CARE_PROVIDER_SITE_OTHER): Payer: BC Managed Care – PPO | Admitting: Cardiology

## 2020-05-20 ENCOUNTER — Other Ambulatory Visit: Payer: Self-pay

## 2020-05-20 ENCOUNTER — Encounter: Payer: Self-pay | Admitting: Cardiology

## 2020-05-20 VITALS — BP 160/90 | HR 78 | Ht 68.0 in | Wt 182.0 lb

## 2020-05-20 DIAGNOSIS — R011 Cardiac murmur, unspecified: Secondary | ICD-10-CM

## 2020-05-20 DIAGNOSIS — E7800 Pure hypercholesterolemia, unspecified: Secondary | ICD-10-CM

## 2020-05-20 DIAGNOSIS — I10 Essential (primary) hypertension: Secondary | ICD-10-CM | POA: Diagnosis not present

## 2020-05-20 MED ORDER — HYDROCHLOROTHIAZIDE 25 MG PO TABS: 25.0000 mg | ORAL_TABLET | Freq: Every day | ORAL | 5 refills | Status: DC

## 2020-05-20 NOTE — Progress Notes (Signed)
Cardiology Office Note:    Date:  05/20/2020   ID:  Grant Golden, DOB June 01, 1961, MRN [id number]  PCP:  Trey Sailors, PA-C  [facility] Cardiologist:  Debbe Odea, MD  [facility] Electrophysiologist:  None   Referring MD: Trey Sailors, PA-C   Chief Complaint  Patient presents with  . OTHER    Heart murmur. Meds reviewed verbally with pt.   Grant Golden is a 59 y.o. male who is being seen today for the evaluation of cardiac murmur at the request of Trey Sailors, [place].   History of Present Illness:    Grant Golden is a 59 y.o. male with a hx of hypertension, hyperlipidemia who presents due to a murmur.  Patient recently saw her primary care provider for regular visit.  Murmur was noted on exam.  He denies chest pain, shortness of breath, edema.  He has been diagnosed with hypertension and take BP meds for years.  Recently started on Hyzaar which caused him to be dizzy.  He stopped taking Hyzaar about a week ago with resolution of symptoms.  He does not check his blood pressures at home.  Otherwise feels well, has no other concerns at this time.  Past Medical History:  Diagnosis Date  . Allergic rhinitis   . Hyperlipidemia   . Hypertension     Past Surgical History:  Procedure Laterality Date  . COLONOSCOPY  07/07/2010   entire colon was normal repeat in 14yrs  . HERNIA REPAIR Left     Current Medications: Current Meds  Medication Sig  . albuterol (VENTOLIN HFA) 108 (90 Base) MCG/ACT inhaler Inhale 2 puffs into the lungs every 6 (six) hours as needed for wheezing or shortness of breath.  Marland Kitchen amLODipine (NORVASC) 10 MG tablet Take 1 tablet (10 mg total) by mouth daily.  Marland Kitchen atorvastatin (LIPITOR) 20 MG tablet Take 1 tablet (20 mg total) by mouth at bedtime.  . cyclobenzaprine (FLEXERIL) 5 MG tablet Take 1 tablet (5 mg total) by mouth 3 (three) times daily as needed for muscle spasms.  . meloxicam (MOBIC) 7.5 MG tablet TAKE 1  TABLET(7.5 MG) BY MOUTH DAILY  . [DISCONTINUED] losartan-hydrochlorothiazide (HYZAAR) 50-12.5 MG tablet Take 1 tablet by mouth daily.     Allergies:   Patient has no known allergies.   Social History   Socioeconomic History  . Marital status: Married    Spouse name: Not on file  . Number of children: Not on file  . Years of education: Not on file  . Highest education level: Not on file  Occupational History  . Not on file  Tobacco Use  . Smoking status: Former Smoker    Types: Cigarettes  . Smokeless tobacco: Never Used  . Tobacco comment: Quit smoking back in 2001  Vaping Use  . Vaping Use: Never used  Substance and Sexual Activity  . Alcohol use: Yes    Alcohol/week: 0.0 standard drinks  . Drug use: No  . Sexual activity: Not on file  Other Topics Concern  . Not on file  Social History Narrative  . Not on file   Social Determinants of Health   Financial Resource Strain:   . Difficulty of Paying Living Expenses: Not on file  Food Insecurity:   . Worried About Programme researcher, broadcasting/film/video in the Last Year: Not on file  . Ran Out of Food in the Last Year: Not on file  Transportation Needs:   . Lack of Transportation (Medical):  Not on file  . Lack of Transportation (Non-Medical): Not on file  Physical Activity:   . Days of Exercise per Week: Not on file  . Minutes of Exercise per Session: Not on file  Stress:   . Feeling of Stress : Not on file  Social Connections:   . Frequency of Communication with Friends and Family: Not on file  . Frequency of Social Gatherings with Friends and Family: Not on file  . Attends Religious Services: Not on file  . Active Member of Clubs or Organizations: Not on file  . Attends Banker Meetings: Not on file  . Marital Status: Not on file     Family History: The patient's family history includes Arthritis in his father; Heart Problems in his brother and father; Heart disease in his sister.  ROS:   Please see the history of  present illness.     All other systems reviewed and are negative.  EKGs/Labs/Other Studies Reviewed:    The following studies were reviewed today:   EKG:  EKG is  ordered today.  The ekg ordered today demonstrates normal sinus rhythm, normal ECG.  Recent Labs: 04/28/2020: ALT 36; BUN 11; Creatinine, Ser 0.90; Hemoglobin 15.2; Platelets 259; Potassium 4.0; Sodium 143  Recent Lipid Panel    Component Value Date/Time   CHOL 248 (H) 04/28/2020 0848   TRIG 79 04/28/2020 0848   HDL 109 04/28/2020 0848   CHOLHDL 2.3 04/28/2020 0848   LDLCALC 126 (H) 04/28/2020 0848    Physical Exam:    VS:  BP (!) 160/90 (BP Location: Right Arm, Patient Position: Sitting, Cuff Size: Normal)   Pulse 78   Ht 5\' 8"  (1.727 m)   Wt 182 lb (82.6 kg)   SpO2 98%   BMI 27.67 kg/m     Wt Readings from Last 3 Encounters:  05/20/20 182 lb (82.6 kg)  04/28/20 179 lb (81.2 kg)  12/30/19 184 lb 9.6 oz (83.7 kg)     GEN:  Well nourished, well developed in no acute distress HEENT: Normal NECK: No JVD; No carotid bruits LYMPHATICS: No lymphadenopathy CARDIAC: RRR, faint systolic murmur at apex RESPIRATORY:  Clear to auscultation without rales, wheezing or rhonchi  ABDOMEN: Soft, non-tender, non-distended MUSCULOSKELETAL:  No edema; No deformity  SKIN: Warm and dry NEUROLOGIC:  Alert and oriented x 3 PSYCHIATRIC:  Normal affect   ASSESSMENT:    1. Systolic murmur   2. Primary hypertension   3. Pure hypercholesterolemia    PLAN:    In order of problems listed above:  1. Systolic murmur noted on exam.  Get echocardiogram to evaluate any significant valvular pathologies. 2. History of hypertension, BP not controlled.  Stop Hyzaar since it caused dizziness.  Start HCTZ 25 mg daily.  Continue amlodipine.  Patient educated on low-salt diet.  Consider adding ARB if BP not well controlled at follow-up visit 3. History of hyperlipidemia, recently placed on Lipitor 20 mg daily.  Continue.  Follow-up after  echocardiogram.   Medication Adjustments/Labs and Tests Ordered: Current medicines are reviewed at length with the patient today.  Concerns regarding medicines are outlined above.  Orders Placed This Encounter  Procedures  . EKG 12-Lead  . ECHOCARDIOGRAM COMPLETE   Meds ordered this encounter  Medications  . hydrochlorothiazide (HYDRODIURIL) 25 MG tablet    Sig: Take 1 tablet (25 mg total) by mouth daily.    Dispense:  30 tablet    Refill:  5    Patient Instructions  Medication  Instructions:   Your physician has recommended you make the following change in your medication:   1)  STOP taking . 2)  START taking hydrochlorothiazide (HYDRODIURIL) 25 MG tablet: Take 1 tablet (25 mg total) by mouth daily.  *If you need a refill on your cardiac medications before your next appointment, please call your pharmacy*   Lab Work: None Ordered If you have labs (blood work) drawn today and your tests are completely normal, you will receive your results only by: Marland Kitchen MyChart Message (if you have MyChart) OR . A paper copy in the mail If you have any lab test that is abnormal or we need to change your treatment, we will call you to review the results.   Testing/Procedures: Your physician has requested that you have an echocardiogram. Echocardiography is a painless test that uses sound waves to create images of your heart. It provides your doctor with information about the size and shape of your heart and how well your heart's chambers and valves are working. This procedure takes approximately one hour. There are no restrictions for this procedure.     Follow-Up: At [facility], you and your health needs are our priority.  As part of our continuing mission to provide you with exceptional heart care, we have created designated Provider Care Teams.  These Care Teams include your primary Cardiologist (physician) and Advanced Practice Providers (APPs -  Physician Assistants and Nurse  Practitioners) who all work together to provide you with the care you need, when you need it.  We recommend signing up for the patient portal called "MyChart".  Sign up information is provided on this After Visit Summary.  MyChart is used to connect with patients for Virtual Visits (Telemedicine).  Patients are able to view lab/test results, encounter notes, upcoming appointments, etc.  Non-urgent messages can be sent to your provider as well.   To learn more about what you can do with MyChart, go to [email or web address].    Your next appointment:   Follow up after Echo   The format for your next appointment:   In Person  Provider:   Debbe Odea, MD   Other Instructions      Signed, Debbe Odea, MD  05/20/2020 12:50 PM    [facility]

## 2020-05-20 NOTE — Patient Instructions (Signed)
Medication Instructions:   Your physician has recommended you make the following change in your medication:   1)  STOP taking . 2)  START taking hydrochlorothiazide (HYDRODIURIL) 25 MG tablet: Take 1 tablet (25 mg total) by mouth daily.  *If you need a refill on your cardiac medications before your next appointment, please call your pharmacy*   Lab Work: None Ordered If you have labs (blood work) drawn today and your tests are completely normal, you will receive your results only by: Marland Kitchen MyChart Message (if you have MyChart) OR . A paper copy in the mail If you have any lab test that is abnormal or we need to change your treatment, we will call you to review the results.   Testing/Procedures: Your physician has requested that you have an echocardiogram. Echocardiography is a painless test that uses sound waves to create images of your heart. It provides your doctor with information about the size and shape of your heart and how well your heart's chambers and valves are working. This procedure takes approximately one hour. There are no restrictions for this procedure.     Follow-Up: At [facility], you and your health needs are our priority.  As part of our continuing mission to provide you with exceptional heart care, we have created designated Provider Care Teams.  These Care Teams include your primary Cardiologist (physician) and Advanced Practice Providers (APPs -  Physician Assistants and Nurse Practitioners) who all work together to provide you with the care you need, when you need it.  We recommend signing up for the patient portal called "MyChart".  Sign up information is provided on this After Visit Summary.  MyChart is used to connect with patients for Virtual Visits (Telemedicine).  Patients are able to view lab/test results, encounter notes, upcoming appointments, etc.  Non-urgent messages can be sent to your provider as well.   To learn more about what you can do with  MyChart, go to [email or web address].    Your next appointment:   Follow up after Echo   The format for your next appointment:   In Person  Provider:   Debbe Odea, MD   Other Instructions

## 2020-05-25 ENCOUNTER — Other Ambulatory Visit: Payer: Self-pay | Admitting: Physician Assistant

## 2020-05-25 DIAGNOSIS — E7800 Pure hypercholesterolemia, unspecified: Secondary | ICD-10-CM

## 2020-05-25 DIAGNOSIS — I10 Essential (primary) hypertension: Secondary | ICD-10-CM

## 2020-05-25 NOTE — Telephone Encounter (Signed)
Requested Prescriptions  Pending Prescriptions Disp Refills  . amLODipine (NORVASC) 10 MG tablet [Pharmacy Med Name: AMLODIPINE BESYLATE 10MG  TABLETS] 90 tablet 1    Sig: TAKE 1 TABLET(10 MG) BY MOUTH DAILY     Cardiovascular:  Calcium Channel Blockers Failed - 05/25/2020  3:19 AM      Failed - Last BP in normal range    BP Readings from Last 1 Encounters:  05/20/20 (!) 160/90         Passed - Valid encounter within last 6 months    Recent Outpatient Visits          3 weeks ago Essential hypertension   [facility] M, M   4 months ago Neck pain on left side   [facility] M, M   7 months ago Cervical spinal stenosis   [facility] [place] [facility] M, PA-C   7 months ago DDD (degenerative disc disease), cervical   [facility] [place], [place], Lavella Hammock   1 year ago Fatigue, unspecified type   [facility], [facility], Alessandra Bevels      Future Appointments            In 2 weeks Ag[place]Etang, [place], MD [facility], [facility]           . pravastatin (PRAVACHOL) 40 MG tablet [Pharmacy Med Name: PRAVASTATIN 40MG  TABLETS] 90 tablet 0    Sig: TAKE 1 TABLET(40 MG) BY MOUTH DAILY     Cardiovascular:  Antilipid - Statins Failed - 05/25/2020  3:19 AM      Failed - Total Cholesterol in normal range and within 360 days    Cholesterol, Total  Date Value Ref Range Status  04/28/2020 248 (H) 100 - 199 mg/dL Final         Failed - LDL in normal range and within 360 days    LDL Chol Calc (NIH)  Date Value Ref Range Status  04/28/2020 126 (H) 0 - 99 mg/dL Final         Passed - HDL in normal range and within 360 days    HDL  Date Value Ref Range Status  04/28/2020 109 >39 mg/dL Final         Passed - Triglycerides in normal range and within 360 days    Triglycerides  Date Value Ref Range Status  04/28/2020 79 0 - 149 mg/dL Final         Passed - Patient  is not pregnant      Passed - Valid encounter within last 12 months    Recent Outpatient Visits          3 weeks ago Essential hypertension   [facility] [place], Adri[place] M, PA-C   4 months ago Neck pain on left side   [facility] M M, Osvaldo Angst   7 months ago Cervical spinal stenosis   [facility] [place] M, PA-C   7 months ago DDD (degenerative disc disease), cervical   [facility] [place], [facility], [place]   1 year ago Fatigue, unspecified type   [facility], [place], [facility]      Future Appointments            In 2 weeks Agbor-Etang, Alessandra Bevels, MD [facility], [facility]

## 2020-06-06 ENCOUNTER — Encounter: Payer: Self-pay | Admitting: Cardiology

## 2020-06-06 ENCOUNTER — Other Ambulatory Visit: Payer: Self-pay

## 2020-06-06 ENCOUNTER — Ambulatory Visit (INDEPENDENT_AMBULATORY_CARE_PROVIDER_SITE_OTHER): Payer: BC Managed Care – PPO

## 2020-06-06 DIAGNOSIS — R011 Cardiac murmur, unspecified: Secondary | ICD-10-CM

## 2020-06-08 LAB — ECHOCARDIOGRAM COMPLETE
AR max vel: 2.76 cm2
AV Area VTI: 3.1 cm2
AV Area mean vel: 3.07 cm2
AV Mean grad: 3 mmHg
AV Peak grad: 6.6 mmHg
Ao pk vel: 1.28 m/s
Area-P 1/2: 4.33 cm2
Calc EF: 64.1 %
S' Lateral: 2.8 cm
Single Plane A2C EF: 60.7 %
Single Plane A4C EF: 68.7 %

## 2020-06-13 ENCOUNTER — Ambulatory Visit: Payer: BC Managed Care – PPO | Admitting: Cardiology

## 2020-06-14 ENCOUNTER — Encounter: Payer: Self-pay | Admitting: Cardiology

## 2020-06-14 ENCOUNTER — Telehealth: Payer: Self-pay

## 2020-06-14 NOTE — Telephone Encounter (Signed)
The patient has been notified of the result via VM per DPR on file. A Encouraged patient to call back with any questions or concerns.  

## 2020-07-18 ENCOUNTER — Other Ambulatory Visit: Payer: Self-pay

## 2020-07-18 ENCOUNTER — Ambulatory Visit (INDEPENDENT_AMBULATORY_CARE_PROVIDER_SITE_OTHER): Payer: BC Managed Care – PPO | Admitting: Physician Assistant

## 2020-07-18 ENCOUNTER — Encounter: Payer: Self-pay | Admitting: Physician Assistant

## 2020-07-18 VITALS — BP 159/101 | HR 95 | Temp 98.1°F | Wt 183.0 lb

## 2020-07-18 DIAGNOSIS — Z1211 Encounter for screening for malignant neoplasm of colon: Secondary | ICD-10-CM

## 2020-07-18 DIAGNOSIS — M79604 Pain in right leg: Secondary | ICD-10-CM | POA: Diagnosis not present

## 2020-07-18 DIAGNOSIS — I10 Essential (primary) hypertension: Secondary | ICD-10-CM

## 2020-07-18 MED ORDER — LOSARTAN POTASSIUM-HCTZ 50-12.5 MG PO TABS: 1.0000 | ORAL_TABLET | Freq: Two times a day (BID) | ORAL | 1 refills | Status: DC

## 2020-07-18 NOTE — Progress Notes (Signed)
Established patient visit   Patient: Grant Golden   DOB: February 04, 1961   59 y.o. Male  MRN: [id number] Visit Date: 07/18/2020  Today's healthcare provider: Trey Sailors, PA-C   Chief Complaint  Patient presents with  . Leg Pain   Subjective    Leg Pain  The incident occurred more than 1 week ago. There was no injury mechanism. The pain is present in the right leg. The quality of the pain is described as aching. The pain has been constant since onset. Pertinent negatives include no inability to bear weight, loss of motion, loss of sensation, muscle weakness, numbness or tingling. The symptoms are aggravated by movement. The treatment provided no relief.    Reports he was having issues with right leg pain. This happens in the lower part of his right leg. It worsens when he walks. 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 He used to wear orthotics but doesn't any longer. He had some time off for Thanksgiving and his right leg did not hurt him during this time.  Hypertension, follow-up  BP Readings from Last 3 Encounters:  07/18/20 (!) 159/101  05/20/20 (!) 160/90  04/28/20 (!) 148/101   Wt Readings from Last 3 Encounters:  07/18/20 183 lb (83 kg)  05/20/20 182 lb (82.6 kg)  04/28/20 179 lb (81.2 kg)     He was last seen for hypertension 3 months ago.  BP at that visit was elevated. Management since that visit includes start losartan-hctz 50-12.5. patient was started on this at previous visit for HTN. However, he was seen by cardiology for murmur and during that time he mentioned he was dizzy. Was switched to just plain hctz 25 mg QD and amlodipine 10 mg daily. He ran out of HCTZ however since then and has returned to taking losartan-hctz. He reports he doesn't feel very dizzy.   He reports excellent compliance with treatment. He is not having side effects.  He is following a Regular diet. He is not exercising. He does not  smoke.  Use of agents associated with hypertension: none.   Outside blood pressures are elevated. Symptoms: No chest pain No chest pressure  No palpitations No syncope  No dyspnea No orthopnea  No paroxysmal nocturnal dyspnea No lower extremity edema   Pertinent labs: Lab Results  Component Value Date   CHOL 248 (H) 04/28/2020   HDL 109 04/28/2020   LDLCALC 126 (H) 04/28/2020   TRIG 79 04/28/2020   CHOLHDL 2.3 04/28/2020   Lab Results  Component Value Date   NA 143 04/28/2020   K 4.0 04/28/2020   CREATININE 0.90 04/28/2020   GFRNONAA 93 04/28/2020   GFRAA 107 04/28/2020   GLUCOSE 131 (H) 04/28/2020     The ASCVD Risk score (Goff DC Jr., et al., 2013) failed to calculate for the following reasons:   The valid HDL cholesterol range is 20 to 100 mg/dL   ---------------------------------------------------------------------------------------------------   Patient Active Problem List   Diagnosis Date Noted  . Essential hypertension 10/27/2019  . Allergic rhinitis 09/14/2015  . Cervical spinal stenosis 09/14/2015  . Hypercholesterolemia 09/14/2015   Past Medical History:  Diagnosis Date  . Allergic rhinitis   . Hyperlipidemia   . Hypertension    Social History   Tobacco Use  . Smoking status: Former Smoker    Types: Cigarettes  . Smokeless tobacco: Never Used  . Tobacco comment: Quit smoking back in 2001  Vaping Use  .  Vaping Use: Never used  Substance Use Topics  . Alcohol use: Yes    Alcohol/week: 0.0 standard drinks  . Drug use: No   No Known Allergies   Medications: Outpatient Medications Prior to Visit  Medication Sig  . albuterol (VENTOLIN HFA) 108 (90 Base) MCG/ACT inhaler Inhale 2 puffs into the lungs every 6 (six) hours as needed for wheezing or shortness of breath.  Marland Kitchen amLODipine (NORVASC) 10 MG tablet TAKE 1 TABLET(10 MG) BY MOUTH DAILY  . atorvastatin (LIPITOR) 20 MG tablet Take 1 tablet (20 mg total) by mouth at bedtime.  . cyclobenzaprine  (FLEXERIL) 5 MG tablet Take 1 tablet (5 mg total) by mouth 3 (three) times daily as needed for muscle spasms.  . meloxicam (MOBIC) 7.5 MG tablet TAKE 1 TABLET(7.5 MG) BY MOUTH DAILY  . naproxen (NAPROSYN) 250 MG tablet Take by mouth.  . hydrochlorothiazide (HYDRODIURIL) 25 MG tablet Take 1 tablet (25 mg total) by mouth daily. (Patient not taking: Reported on 07/18/2020)   No facility-administered medications prior to visit.    Review of Systems  Constitutional: Negative.   Respiratory: Negative.   Cardiovascular: Negative.   Musculoskeletal: Positive for myalgias. Negative for arthralgias, back pain, gait problem, joint swelling, neck pain and neck stiffness.  Neurological: Negative for tingling and numbness.      Objective    BP (!) 159/101 (BP Location: Left Arm, Patient Position: Sitting, Cuff Size: Large)   Pulse 95   Temp 98.1 F (36.7 C) (Oral)   Wt 183 lb (83 kg)   BMI 27.83 kg/m    Physical Exam Constitutional:      Appearance: Normal appearance.  Cardiovascular:     Rate and Rhythm: Normal rate and regular rhythm.     Heart sounds: Normal heart sounds.  Pulmonary:     Effort: Pulmonary effort is normal.     Breath sounds: Normal breath sounds.  Musculoskeletal:     Right lower leg: Normal.     Left lower leg: Normal.  Skin:    General: Skin is warm and dry.  Neurological:     Mental Status: He is alert and oriented to person, place, and time. Mental status is at baseline.  Psychiatric:        Mood and Affect: Mood normal.        Behavior: Behavior normal.       No results found for any visits on 07/18/20.  Assessment & Plan    1. Essential hypertension  Increase for better BP control.   - losartan-hydrochlorothiazide (HYZAAR) 50-12.5 MG tablet; Take 1 tablet by mouth in the morning and at bedtime.  Dispense: 180 tablet; Refill: 1  2. Right leg pain  Suspect this is related to his steel toed boots. Suggest orthotics and possibly switching his  shoes.  3. Colon cancer screening  - Ambulatory referral to Gastroenterology   No follow-ups on file.      ITrey Sailors, PA-C, have reviewed all documentation for this visit. The documentation on 07/19/20 for the exam, diagnosis, procedures, and orders are all accurate and complete.  The entirety of the information documented in the History of Present Illness, Review of Systems and Physical Exam were personally obtained by me. Portions of this information were initially documented by [facility] and reviewed by me for thoroughness and accuracy.     Maryella Shivers  [facility] [phone] (phone) [phone] (fax)  [facility]

## 2020-07-26 ENCOUNTER — Encounter: Payer: Self-pay | Admitting: *Deleted

## 2020-08-03 ENCOUNTER — Telehealth (INDEPENDENT_AMBULATORY_CARE_PROVIDER_SITE_OTHER): Payer: Self-pay | Admitting: Gastroenterology

## 2020-08-03 ENCOUNTER — Other Ambulatory Visit: Payer: Self-pay

## 2020-08-03 DIAGNOSIS — Z1211 Encounter for screening for malignant neoplasm of colon: Secondary | ICD-10-CM

## 2020-08-03 MED ORDER — NA SULFATE-K SULFATE-MG SULF 17.5-3.13-1.6 GM/177ML PO SOLN: 1.0000 | Freq: Once | ORAL | 0 refills | Status: AC

## 2020-08-03 NOTE — Progress Notes (Signed)
Gastroenterology Pre-Procedure Review  Request Date: Tuesday 08/30/20 Requesting Physician: Dr. Tahiliani  PATIENT REVIEW QUESTIONS: The patient responded to the following health history questions as indicated:    1. Are you having any GI issues? no 2. Do you have a personal history of Polyps? no 3. Do you have a family history of Colon Cancer or Polyps? no 4. Diabetes Mellitus? no 5. Joint replacements in the past 12 months?no 6. Major health problems in the past 3 months?no 7. Any artificial heart valves, MVP, or defibrillator?no    MEDICATIONS & ALLERGIES:    Patient reports the following regarding taking any anticoagulation/antiplatelet therapy:   Plavix, Coumadin, Eliquis, Xarelto, Lovenox, Pradaxa, Brilinta, or Effient? no Aspirin? no  Patient confirms/reports the following medications:  Current Outpatient Medications  Medication Sig Dispense Refill  . albuterol (VENTOLIN HFA) 108 (90 Base) MCG/ACT inhaler Inhale 2 puffs into the lungs every 6 (six) hours as needed for wheezing or shortness of breath. 18 g 1  . amLODipine (NORVASC) 10 MG tablet TAKE 1 TABLET(10 MG) BY MOUTH DAILY 90 tablet 1  . atorvastatin (LIPITOR) 20 MG tablet Take 1 tablet (20 mg total) by mouth at bedtime. 90 tablet 0  . chlorhexidine (PERIDEX) 0.12 % solution SMARTSIG:0.5 Ounce(s) By Mouth Twice Daily    . cyclobenzaprine (FLEXERIL) 5 MG tablet Take 1 tablet (5 mg total) by mouth 3 (three) times daily as needed for muscle spasms. 30 tablet 1  . losartan-hydrochlorothiazide (HYZAAR) 50-12.5 MG tablet Take 1 tablet by mouth in the morning and at bedtime. 180 tablet 1  . meloxicam (MOBIC) 7.5 MG tablet TAKE 1 TABLET(7.5 MG) BY MOUTH DAILY 90 tablet 0  . naproxen (NAPROSYN) 250 MG tablet Take by mouth.    . Na Sulfate-K Sulfate-Mg Sulf 17.5-3.13-1.6 GM/177ML SOLN Take 1 kit by mouth once for 1 dose. 354 mL 0   No current facility-administered medications for this visit.    Patient confirms/reports the  following allergies:  No Known Allergies  No orders of the defined types were placed in this encounter.   AUTHORIZATION INFORMATION Primary Insurance: 1D#: Group #:  Secondary Insurance: 1D#: Group #:  SCHEDULE INFORMATION: Date: 08/30/20 Time: Location:[facility] 

## 2020-08-16 NOTE — Progress Notes (Signed)
Established patient visit   Patient: Grant Golden   DOB: [birth year]   59 y.o. Male  MRN: [id number] Visit Date: 08/17/2020  Today's healthcare provider: Trey Sailors, PA-C   Chief Complaint  Patient presents with  . Hypertension   Subjective    HPI  Hypertension, follow-up  BP Readings from Last 3 Encounters:  08/17/20 (!) 158/94  07/18/20 (!) 159/101  05/20/20 (!) 160/90   Wt Readings from Last 3 Encounters:  08/17/20 179 lb (81.2 kg)  07/18/20 183 lb (83 kg)  05/20/20 182 lb (82.6 kg)     He was last seen for hypertension 1 months ago.  BP at that visit was 159/101. Management since that visit includes increased losartan-hydrochlorothiazide to 50-12.5 MG tablet twice daily in addition to amlodipine 10 mg daily.   He reports good compliance with treatment. He is not having side effects.  He is following a Regular diet. He is exercising. He does not smoke.  Use of agents associated with hypertension: none.   Outside blood pressures are checked occasionally. Symptoms: No chest pain No chest pressure  No palpitations No syncope  No dyspnea No orthopnea  No paroxysmal nocturnal dyspnea No lower extremity edema   Pertinent labs: Lab Results  Component Value Date   CHOL 248 (H) 04/28/2020   HDL 109 04/28/2020   LDLCALC 126 (H) 04/28/2020   TRIG 79 04/28/2020   CHOLHDL 2.3 04/28/2020   Lab Results  Component Value Date   NA 143 04/28/2020   K 4.0 04/28/2020   CREATININE 0.90 04/28/2020   GFRNONAA 93 04/28/2020   GFRAA 107 04/28/2020   GLUCOSE 131 (H) 04/28/2020     The ASCVD Risk score (Goff DC Jr., et al., 2013) failed to calculate for the following reasons:   The valid HDL cholesterol range is 20 to 100 mg/dL   ---------------------------------------------------------------------------------------------------      Medications: Outpatient Medications Prior to Visit  Medication Sig  . albuterol (VENTOLIN HFA) 108 (90 Base) MCG/ACT  inhaler Inhale 2 puffs into the lungs every 6 (six) hours as needed for wheezing or shortness of breath.  Marland Kitchen amLODipine (NORVASC) 10 MG tablet TAKE 1 TABLET(10 MG) BY MOUTH DAILY  . atorvastatin (LIPITOR) 20 MG tablet Take 1 tablet (20 mg total) by mouth at bedtime.  . chlorhexidine (PERIDEX) 0.12 % solution SMARTSIG:0.5 Ounce(s) By Mouth Twice Daily  . cyclobenzaprine (FLEXERIL) 5 MG tablet Take 1 tablet (5 mg total) by mouth 3 (three) times daily as needed for muscle spasms.  Marland Kitchen losartan-hydrochlorothiazide (HYZAAR) 50-12.5 MG tablet Take 1 tablet by mouth in the morning and at bedtime.  . meloxicam (MOBIC) 7.5 MG tablet TAKE 1 TABLET(7.5 MG) BY MOUTH DAILY  . naproxen (NAPROSYN) 250 MG tablet Take by mouth.   No facility-administered medications prior to visit.    Review of Systems  Constitutional: Negative.   Respiratory: Negative.   Cardiovascular: Negative.   Musculoskeletal: Negative.   Neurological: Negative.   Psychiatric/Behavioral: Negative.       Objective    BP (!) 158/94   Pulse (!) 102   Temp 99 F (37.2 C)   Resp 16   Ht 5' 7.5" (1.715 m)   Wt 179 lb (81.2 kg)   BMI 27.62 kg/m    Physical Exam Constitutional:      Appearance: Normal appearance.  Cardiovascular:     Rate and Rhythm: Normal rate and regular rhythm.     Heart sounds: Normal heart sounds.  Pulmonary:     Effort: Pulmonary effort is normal.     Breath sounds: Normal breath sounds.  Skin:    General: Skin is warm and dry.  Neurological:     Mental Status: He is alert and oriented to person, place, and time. Mental status is at baseline.  Psychiatric:        Mood and Affect: Mood normal.        Behavior: Behavior normal.       No results found for any visits on 08/17/20.  Assessment & Plan    1. Primary hypertension  On three anti-HTN medications without significant decrease in blood pressure. Will pursue workup of secondary hypertension with urine metanephrines and renal US. If  negative, may pursue pursue workup for primary aldosteronism and/or refer to cardiology. We will schedule follow up pending results of testing. Continue medications for now.   - US Renal Artery Stenosis; Future  2. Resistant hypertension  - Metanephrines, urine, 24 hour; Future   No follow-ups on file.      ITrey Sailors, PA-C, have reviewed all documentation for this visit. The documentation on 08/18/20 for the exam, diagnosis, procedures, and orders are all accurate and complete.  The entirety of the information documented in the History of Present Illness, Review of Systems and Physical Exam were personally obtained by me. Portions of this information were initially documented by [place]son [place], CMA and reviewed by me for thoroughness and accuracy.   I spent 30 minutes dedicated to the care of this patient on the date of this encounter to include pre-visit review of records, face-to-face time with the patient discussing secondary hypertension, and post visit ordering of testing.   Maryella Shivers  [facility] [phone] (phone) [phone] (fax)  [facility]

## 2020-08-17 ENCOUNTER — Other Ambulatory Visit: Payer: Self-pay

## 2020-08-17 ENCOUNTER — Ambulatory Visit (INDEPENDENT_AMBULATORY_CARE_PROVIDER_SITE_OTHER): Payer: BC Managed Care – PPO | Admitting: Physician Assistant

## 2020-08-17 ENCOUNTER — Encounter: Payer: Self-pay | Admitting: Physician Assistant

## 2020-08-17 VITALS — BP 158/94 | HR 102 | Temp 99.0°F | Resp 16 | Ht 67.5 in | Wt 179.0 lb

## 2020-08-17 DIAGNOSIS — I10 Essential (primary) hypertension: Secondary | ICD-10-CM | POA: Diagnosis not present

## 2020-08-17 DIAGNOSIS — I1A0 Resistant hypertension: Secondary | ICD-10-CM

## 2020-08-22 ENCOUNTER — Other Ambulatory Visit: Payer: Self-pay | Admitting: Physician Assistant

## 2020-08-22 DIAGNOSIS — I10 Essential (primary) hypertension: Secondary | ICD-10-CM | POA: Diagnosis not present

## 2020-08-23 ENCOUNTER — Encounter: Payer: Self-pay | Admitting: Gastroenterology

## 2020-08-23 ENCOUNTER — Other Ambulatory Visit: Payer: Self-pay

## 2020-08-25 ENCOUNTER — Encounter: Payer: Self-pay | Admitting: Anesthesiology

## 2020-08-26 ENCOUNTER — Other Ambulatory Visit
Admission: RE | Admit: 2020-08-26 | Discharge: 2020-08-26 | Disposition: A | Discharge status: Home / Self Care | Payer: BC Managed Care – PPO | Source: Ambulatory Visit | Attending: Gastroenterology | Admitting: Gastroenterology

## 2020-08-26 ENCOUNTER — Other Ambulatory Visit: Payer: Self-pay

## 2020-08-26 DIAGNOSIS — Z01812 Encounter for preprocedural laboratory examination: Secondary | ICD-10-CM | POA: Insufficient documentation

## 2020-08-26 DIAGNOSIS — Z20822 Contact with and (suspected) exposure to covid-19: Secondary | ICD-10-CM | POA: Insufficient documentation

## 2020-08-26 LAB — SARS CORONAVIRUS 2 (TAT 6-24 HRS): SARS Coronavirus 2: NEGATIVE

## 2020-08-29 NOTE — Discharge Instructions (Signed)

## 2020-08-30 ENCOUNTER — Telehealth (INDEPENDENT_AMBULATORY_CARE_PROVIDER_SITE_OTHER): Payer: BC Managed Care – PPO | Admitting: Physician Assistant

## 2020-08-30 ENCOUNTER — Ambulatory Visit
Admission: RE | Admit: 2020-08-30 | Payer: BC Managed Care – PPO | Source: Home / Self Care | Admitting: Gastroenterology

## 2020-08-30 DIAGNOSIS — U071 COVID-19: Secondary | ICD-10-CM | POA: Diagnosis not present

## 2020-08-30 DIAGNOSIS — R059 Cough, unspecified: Secondary | ICD-10-CM

## 2020-08-30 HISTORY — DX: Presence of dental prosthetic device (complete) (partial): Z97.2

## 2020-08-30 HISTORY — DX: Unspecified osteoarthritis, unspecified site: M19.90

## 2020-08-30 SURGERY — COLONOSCOPY WITH PROPOFOL
Anesthesia: Choice

## 2020-08-30 MED ORDER — BENZONATATE 100 MG PO CAPS: 100.0000 mg | ORAL_CAPSULE | Freq: Three times a day (TID) | ORAL | 0 refills | Status: AC | PRN

## 2020-08-30 NOTE — Progress Notes (Signed)
MyChart Video Visit    Virtual Visit via Video Note   This visit type was conducted due to national recommendations for restrictions regarding the COVID-19 Pandemic (e.g. social distancing) in an effort to limit this patient's exposure and mitigate transmission in our community. This patient is at least at moderate risk for complications without adequate follow up. This format is felt to be most appropriate for this patient at this time. Physical exam was limited by quality of the video and audio technology used for the visit.   Patient location: Home Provider location: Office   I discussed the limitations of evaluation and management by telemedicine and the availability of in person appointments. The patient expressed understanding and agreed to proceed.  Patient: Grant Golden   DOB: [birth year]   60 y.o. Male  MRN: [id number] Visit Date: 08/30/2020  Today's healthcare provider: Trey Sailors, PA-C   Chief Complaint  Patient presents with  . URI  I,Porsha C McClurkin,acting as a scribe for [employer], PA-C.,have documented all relevant documentation on the behalf of Trey Sailors, PA-C,as directed by  Trey Sailors, PA-C while in the presence of Trey Sailors, PA-C.  Subjective    URI  This is a new problem. The current episode started in the past 7 days. The problem has been unchanged. Associated symptoms include congestion, coughing, headaches and rhinorrhea. Pertinent negatives include no diarrhea, ear pain, nausea, sinus pain, sore throat, vomiting or wheezing. He has tried increased fluids and antihistamine for the symptoms. The treatment provided mild relief.  Took 2 different home covid test this morning,08/30/2020 and they were positive. Symptoms started 08/28/2020. Took cold and flu OTC medication which was mildly helpful. Denies SOB or trouble breathing.     Medications: Outpatient Medications Prior to Visit  Medication Sig  . albuterol  (VENTOLIN HFA) 108 (90 Base) MCG/ACT inhaler Inhale 2 puffs into the lungs every 6 (six) hours as needed for wheezing or shortness of breath.  Marland Kitchen amLODipine (NORVASC) 10 MG tablet TAKE 1 TABLET(10 MG) BY MOUTH DAILY  . atorvastatin (LIPITOR) 20 MG tablet Take 1 tablet (20 mg total) by mouth at bedtime.  . chlorhexidine (PERIDEX) 0.12 % solution SMARTSIG:0.5 Ounce(s) By Mouth Twice Daily  . cyclobenzaprine (FLEXERIL) 5 MG tablet Take 1 tablet (5 mg total) by mouth 3 (three) times daily as needed for muscle spasms.  Marland Kitchen losartan-hydrochlorothiazide (HYZAAR) 50-12.5 MG tablet Take 1 tablet by mouth in the morning and at bedtime.  . meloxicam (MOBIC) 7.5 MG tablet TAKE 1 TABLET(7.5 MG) BY MOUTH DAILY  . naproxen (NAPROSYN) 250 MG tablet Take by mouth.   No facility-administered medications prior to visit.    Review of Systems  Constitutional: Negative for appetite change, chills, fatigue and fever.  HENT: Positive for congestion, postnasal drip and rhinorrhea. Negative for ear pain, sinus pressure, sinus pain and sore throat.   Respiratory: Positive for cough. Negative for chest tightness, shortness of breath and wheezing.   Gastrointestinal: Negative for diarrhea, nausea and vomiting.  Neurological: Positive for headaches. Negative for weakness.      Objective    There were no vitals taken for this visit.   Physical Exam Constitutional:      Appearance: Normal appearance.  Pulmonary:     Effort: Pulmonary effort is normal. No respiratory distress.  Neurological:     Mental Status: He is alert.  Psychiatric:        Mood and Affect: Mood normal.  Behavior: Behavior normal.        Assessment & Plan    1. COVID-19  Your COVID test is positive. You should remain isolated and quarantine for at least 10 days from start of symptoms, or today if you are asymptomatic. You must be feeling better and be fever free without any fever reducers for at least 48 hours as well. Your  household contacts should be tested as well as work contacts. If you feel worse or have increasing shortness of breath, you should be seen in person at urgent care or the emergency room.   Placed for COVID infusion center referral, counseled about limited availability and that he may not qualify. Advised to look at other health organizations and [facility] website for treatment centers if he would like to pursue this. Patient asking for work note, advised to make a MyChart and I can send not through portal. Sent activation code to his cell phone number.   2. Cough  Tessalon perles 100 mg TID.    No follow-ups on file.     I discussed the assessment and treatment plan with the patient. The patient was provided an opportunity to ask questions and all were answered. The patient agreed with the plan and demonstrated an understanding of the instructions.   The patient was advised to call back or seek an in-person evaluation if the symptoms worsen or if the condition fails to improve as anticipated.   ITrey Sailors, PA-C, have reviewed all documentation for this visit. The documentation on 08/30/20 for the exam, diagnosis, procedures, and orders are all accurate and complete.  The entirety of the information documented in the History of Present Illness, Review of Systems and Physical Exam were personally obtained by me. Portions of this information were initially documented by [facility] and reviewed by me for thoroughness and accuracy.    Maryella Shivers [facility] [phone] (phone) [phone] (fax)  [facility]

## 2020-08-31 ENCOUNTER — Telehealth: Payer: Self-pay

## 2020-08-31 LAB — METANEPHRINES, URINE, 24 HOUR
Metaneph Total, Ur: 108 ug/L
Metanephrines, 24H Ur: 108 ug/24 hr (ref 58–276)
Normetanephrine, 24H Ur: 298 ug/24 hr (ref 156–729)
Normetanephrine, Ur: 298 ug/L

## 2020-08-31 NOTE — Telephone Encounter (Signed)
-----   Message from Trey Sailors, [place] sent at 08/31/2020  9:53 AM EST ----- The urine testing for his blood pressure is negative. We are waiting on his ultrasound results.

## 2020-08-31 NOTE — Telephone Encounter (Signed)
Called patient and no answer. LVMTRC and if patient calls back okay for PEC to advise. 

## 2020-09-01 ENCOUNTER — Telehealth: Payer: Self-pay

## 2020-09-01 NOTE — Telephone Encounter (Signed)
Copied from CRM [id number]. Topic: General - Other >> Sep 01, 2020  3:26 PM Marylen Ponto wrote: Reason for CRM: Pt called to reschedule ultrasound appt. Spoke with Rosey Bath in the office and she advised to send a message and someone will call pt back with information on rescheduling the ultrasound appt. Informed pt someone will return his call

## 2020-09-05 ENCOUNTER — Ambulatory Visit: Payer: BC Managed Care – PPO

## 2020-09-09 NOTE — Telephone Encounter (Signed)
Visible to patient:  Yes (seen  

## 2020-10-19 ENCOUNTER — Telehealth: Payer: Self-pay

## 2020-10-19 DIAGNOSIS — E7800 Pure hypercholesterolemia, unspecified: Secondary | ICD-10-CM

## 2020-10-19 DIAGNOSIS — I10 Essential (primary) hypertension: Secondary | ICD-10-CM

## 2020-10-19 MED ORDER — AMLODIPINE BESYLATE 10 MG PO TABS: ORAL_TABLET | ORAL | 1 refills | Status: DC

## 2020-10-19 MED ORDER — ATORVASTATIN CALCIUM 20 MG PO TABS: 20.0000 mg | ORAL_TABLET | Freq: Every day | ORAL | 1 refills | Status: DC

## 2020-10-19 NOTE — Telephone Encounter (Signed)
A request from [facility] for medication refill of Atorvastatin and Amlodipine was send for patient. L.O.V. was on 08/17/2020 and medication was send into pharmacy.

## 2021-05-10 ENCOUNTER — Ambulatory Visit (INDEPENDENT_AMBULATORY_CARE_PROVIDER_SITE_OTHER): Payer: BC Managed Care – PPO

## 2021-05-10 ENCOUNTER — Other Ambulatory Visit: Payer: Self-pay

## 2021-05-10 ENCOUNTER — Ambulatory Visit
Admission: EM | Admit: 2021-05-10 | Discharge: 2021-05-10 | Disposition: A | Discharge status: Home / Self Care | Payer: BC Managed Care – PPO | Attending: Family Medicine | Admitting: Family Medicine

## 2021-05-10 ENCOUNTER — Ambulatory Visit: Payer: Self-pay | Admitting: *Deleted

## 2021-05-10 DIAGNOSIS — R1033 Periumbilical pain: Secondary | ICD-10-CM | POA: Diagnosis not present

## 2021-05-10 DIAGNOSIS — R109 Unspecified abdominal pain: Secondary | ICD-10-CM | POA: Diagnosis not present

## 2021-05-10 DIAGNOSIS — K3532 Acute appendicitis with perforation and localized peritonitis, without abscess: Secondary | ICD-10-CM | POA: Diagnosis not present

## 2021-05-10 DIAGNOSIS — K5900 Constipation, unspecified: Secondary | ICD-10-CM

## 2021-05-10 DIAGNOSIS — R1084 Generalized abdominal pain: Secondary | ICD-10-CM

## 2021-05-10 DIAGNOSIS — Z20822 Contact with and (suspected) exposure to covid-19: Secondary | ICD-10-CM | POA: Diagnosis not present

## 2021-05-10 DIAGNOSIS — R7402 Elevation of levels of lactic acid dehydrogenase (LDH): Secondary | ICD-10-CM | POA: Diagnosis not present

## 2021-05-10 DIAGNOSIS — E785 Hyperlipidemia, unspecified: Secondary | ICD-10-CM | POA: Diagnosis not present

## 2021-05-10 DIAGNOSIS — R110 Nausea: Secondary | ICD-10-CM | POA: Diagnosis not present

## 2021-05-10 DIAGNOSIS — R1032 Left lower quadrant pain: Secondary | ICD-10-CM | POA: Diagnosis not present

## 2021-05-10 DIAGNOSIS — K353 Acute appendicitis with localized peritonitis, without perforation or gangrene: Secondary | ICD-10-CM | POA: Diagnosis not present

## 2021-05-10 DIAGNOSIS — I10 Essential (primary) hypertension: Secondary | ICD-10-CM | POA: Diagnosis not present

## 2021-05-10 MED ORDER — POLYETHYLENE GLYCOL 3350 17 GM/SCOOP PO POWD: ORAL | 0 refills | Status: DC

## 2021-05-10 NOTE — ED Provider Notes (Signed)
[facility]    CSN: [id number] Arrival date & time: 05/10/21  1243      History   Chief Complaint Chief Complaint  Patient presents with   Bloated   HPI 60 year old male presents with bloating and intermittent sharp abdominal pains.  Patient reports the above symptoms for the past day.  He states that it started after eating some cookies yesterday.  He has not eaten since yesterday.  He has not had a bowel movement since yesterday.  Feels bloated.  He has had some nausea but no vomiting.  He has been able to work today without difficulty.  No other associated symptoms.  No other complaints.   Past Medical History:  Diagnosis Date   Allergic rhinitis    Arthritis    shoulders   Hyperlipidemia    Hypertension    Wears dentures    partail upper and lower    Patient Active Problem List   Diagnosis Date Noted   Essential hypertension 10/27/2019   Allergic rhinitis 09/14/2015   Cervical spinal stenosis 09/14/2015   Hypercholesterolemia 09/14/2015    Past Surgical History:  Procedure Laterality Date   COLONOSCOPY  07/07/2010   entire colon was normal repeat in 74yrs   HERNIA REPAIR Left        Home Medications    Prior to Admission medications   Medication Sig Start Date End Date Taking? Authorizing Provider  chlorhexidine (PERIDEX) 0.12 % solution SMARTSIG:0.5 Ounce(s) By Mouth Twice Daily 06/27/20  Yes [provider]  polyethylene glycol powder (GLYCOLAX/MIRALAX) 17 GM/SCOOP powder 17 g once or twice daily for constipation. 05/10/21  Yes Jonthan Leite G, DO  losartan-hydrochlorothiazide (HYZAAR) 50-12.5 MG tablet Take 1 tablet by mouth in the morning and at bedtime. 07/18/20 01/14/21  Trey Sailors, PA-C  meloxicam (MOBIC) 7.5 MG tablet TAKE 1 TABLET(7.5 MG) BY MOUTH DAILY 03/09/20   Trey Sailors, PA-C    Family History Family History  Problem Relation Age of Onset   Arthritis Father    Heart Problems Father    Heart disease Sister     Heart Problems Brother     Social History Social History   Tobacco Use   Smoking status: Former    Types: Cigarettes    Quit date: 2001    Years since quitting: 21.7   Smokeless tobacco: Never   Tobacco comments:    Quit smoking back in 2001  Vaping Use   Vaping Use: Never used  Substance Use Topics   Alcohol use: Yes    Alcohol/week: 0.0 standard drinks    Comment: occasional   Drug use: No     Allergies   Patient has no known allergies.   Review of Systems Review of Systems Per HPI  Physical Exam Triage Vital Signs ED Triage Vitals  Enc Vitals Group     BP 05/10/21 1333 (!) 150/100     Pulse Rate 05/10/21 1333 87     Resp 05/10/21 1333 18     Temp 05/10/21 1333 98.1 F (36.7 C)     Temp Source 05/10/21 1333 Oral     SpO2 05/10/21 1333 97 %     Weight 05/10/21 1331 180 lb (81.6 kg)     Height 05/10/21 1331 5\' 7"  (1.702 m)     Head Circumference --      Peak Flow --      Pain Score 05/10/21 1331 5     Pain Loc --  Pain Edu? --      Excl. in GC? --    Updated Vital Signs BP (!) 150/100 (BP Location: Left Arm)   Pulse 87   Temp 98.1 F (36.7 C) (Oral)   Resp 18   Ht 5\' 7"  (1.702 m)   Wt 81.6 kg   SpO2 97%   BMI 28.19 kg/m   Visual Acuity Right Eye Distance:   Left Eye Distance:   Bilateral Distance:    Right Eye Near:   Left Eye Near:    Bilateral Near:     Physical Exam Vitals and nursing note reviewed.  Constitutional:      General: He is not in acute distress.    Appearance: Normal appearance. He is not ill-appearing.  HENT:     Head: Normocephalic and atraumatic.  Eyes:     General:        Right eye: No discharge.        Left eye: No discharge.     Conjunctiva/sclera: Conjunctivae normal.  Cardiovascular:     Rate and Rhythm: Normal rate and regular rhythm.  Pulmonary:     Effort: Pulmonary effort is normal.     Breath sounds: Normal breath sounds. No wheezing or rales.  Abdominal:     General: Bowel sounds are  normal. There is no distension.     Palpations: Abdomen is soft.     Tenderness: There is no abdominal tenderness.  Neurological:     Mental Status: He is alert.  Psychiatric:        Mood and Affect: Mood normal.        Behavior: Behavior normal.     UC Treatments / Results  Labs (all labs ordered are listed, but only abnormal results are displayed) Labs Reviewed - No data to display  EKG   Radiology DG Abd 1 View  Result Date: 05/10/2021 CLINICAL DATA:  Bloating, intermittent pain EXAM: ABDOMEN - 1 VIEW COMPARISON:  None. FINDINGS: Nonobstructive pattern of bowel gas. No free air in the abdomen. Moderate burden of stool in the left and right colon. No radio-opaque calculi or other significant radiographic abnormality are seen. Phleboliths in the pelvis. IMPRESSION: Nonobstructive pattern of bowel gas. No free air in the abdomen. Moderate burden of stool in the left and right colon. Electronically Signed   By: 05/12/2021 M.D.   On: 05/10/2021 14:26    Procedures Procedures (including critical care time)  Medications Ordered in UC Medications - No data to display  Initial Impression / Assessment and Plan / UC Course  I have reviewed the triage vital signs and the nursing notes.  Pertinent labs & imaging results that were available during my care of the patient were reviewed by me and considered in my medical decision making (see chart for details).    60 year old male presents with intermittent sharp abdominal pain and bloating.  KUB was obtained and was independently reviewed by me.  Moderate stool burden.  He has positive bowel sounds in all 4 quadrants.  No significant tenderness on exam.  Treating for constipation with MiraLAX.  Supportive care.  Final Clinical Impressions(s) / UC Diagnoses   Final diagnoses:  Generalized abdominal pain  Constipation, unspecified constipation type   Discharge Instructions   None    ED Prescriptions     Medication Sig Dispense  Auth. Provider   polyethylene glycol powder (GLYCOLAX/MIRALAX) 17 GM/SCOOP powder 17 g once or twice daily for constipation. 500 g 77, Tommie Sams  PDMP not reviewed this encounter.   Tommie Sams, [place] 05/10/21 1511

## 2021-05-10 NOTE — Telephone Encounter (Signed)
Patient is calling to report LRQ pain/tenderness that started yesterday and kept him from sleeping last night. The pain has not gotten better- advised UC- no appointment within disposition in office.

## 2021-05-10 NOTE — ED Triage Notes (Signed)
Pt here with C/O Bloating feeling for 1 day. Pt states he was up all night with on and off again sharp pains.

## 2021-05-10 NOTE — Telephone Encounter (Signed)
Patient complains of stomach pain started yesterday- could not sleep- feels bloated,sharp pain, tender on right side Reason for Disposition  [1] MILD-MODERATE pain AND [2] constant AND [3] present > 2 hours  Answer Assessment - Initial Assessment Questions 1. LOCATION: "Where does it hurt?"      Lower R side 2. RADIATION: "Does the pain shoot anywhere else?" (e.g., chest, back)     At umbilicus- sharp pain 3. ONSET: "When did the pain begin?" (Minutes, hours or days ago)      yesterday 4. SUDDEN: "Gradual or sudden onset?"     sudden 5. PATTERN "Does the pain come and go, or is it constant?"    - If constant: "Is it getting better, staying the same, or worsening?"      (Note: Constant means the pain never goes away completely; most serious pain is constant and it progresses)     - If intermittent: "How long does it last?" "Do you have pain now?"     (Note: Intermittent means the pain goes away completely between bouts)     Comes and goes- sharp pain- seconds, other pain is constant 6. SEVERITY: "How bad is the pain?"  (e.g., Scale 1-10; mild, moderate, or severe)    - MILD (1-3): doesn't interfere with normal activities, abdomen soft and not tender to touch     - MODERATE (4-7): interferes with normal activities or awakens from sleep, abdomen tender to touch     - SEVERE (8-10): excruciating pain, doubled over, unable to do any normal activities       moderate 7. RECURRENT SYMPTOM: "Have you ever had this type of stomach pain before?" If Yes, ask: "When was the last time?" and "What happened that time?"      Similar pain 1 year ago- went away 8. CAUSE: "What do you think is causing the stomach pain?"     Unsure- thought gas 9. RELIEVING/AGGRAVATING FACTORS: "What makes it better or worse?" (e.g., movement, antacids, bowel movement)     Not eating- afraid the pain would get worse 10. OTHER SYMPTOMS: "Do you have any other symptoms?" (e.g., back pain, diarrhea, fever, urination pain,  vomiting)       Nausea, bloating  Protocols used: Abdominal Pain - Male-A-AH

## 2021-05-11 DIAGNOSIS — E785 Hyperlipidemia, unspecified: Secondary | ICD-10-CM | POA: Diagnosis not present

## 2021-05-11 DIAGNOSIS — K3580 Unspecified acute appendicitis: Secondary | ICD-10-CM | POA: Diagnosis not present

## 2021-05-11 DIAGNOSIS — I10 Essential (primary) hypertension: Secondary | ICD-10-CM | POA: Diagnosis not present

## 2021-05-11 DIAGNOSIS — R1033 Periumbilical pain: Secondary | ICD-10-CM | POA: Diagnosis not present

## 2021-05-11 DIAGNOSIS — Z20822 Contact with and (suspected) exposure to covid-19: Secondary | ICD-10-CM | POA: Diagnosis not present

## 2021-05-11 DIAGNOSIS — R7402 Elevation of levels of lactic acid dehydrogenase (LDH): Secondary | ICD-10-CM | POA: Diagnosis not present

## 2021-05-11 DIAGNOSIS — K3532 Acute appendicitis with perforation and localized peritonitis, without abscess: Secondary | ICD-10-CM | POA: Diagnosis not present

## 2021-05-11 DIAGNOSIS — R1031 Right lower quadrant pain: Secondary | ICD-10-CM | POA: Diagnosis not present

## 2021-05-11 DIAGNOSIS — K353 Acute appendicitis with localized peritonitis, without perforation or gangrene: Secondary | ICD-10-CM | POA: Diagnosis not present

## 2021-05-12 DIAGNOSIS — I10 Essential (primary) hypertension: Secondary | ICD-10-CM | POA: Diagnosis not present

## 2021-05-12 DIAGNOSIS — K3532 Acute appendicitis with perforation and localized peritonitis, without abscess: Secondary | ICD-10-CM | POA: Diagnosis not present

## 2021-05-12 DIAGNOSIS — R7402 Elevation of levels of lactic acid dehydrogenase (LDH): Secondary | ICD-10-CM | POA: Diagnosis not present

## 2021-05-12 DIAGNOSIS — E785 Hyperlipidemia, unspecified: Secondary | ICD-10-CM | POA: Diagnosis not present

## 2021-05-12 DIAGNOSIS — R1033 Periumbilical pain: Secondary | ICD-10-CM | POA: Diagnosis not present

## 2021-05-12 DIAGNOSIS — Z20822 Contact with and (suspected) exposure to covid-19: Secondary | ICD-10-CM | POA: Diagnosis not present

## 2021-05-30 DIAGNOSIS — Z09 Encounter for follow-up examination after completed treatment for conditions other than malignant neoplasm: Secondary | ICD-10-CM | POA: Diagnosis not present

## 2021-05-30 DIAGNOSIS — Z9049 Acquired absence of other specified parts of digestive tract: Secondary | ICD-10-CM | POA: Diagnosis not present

## 2022-03-10 IMAGING — CR DG FEMUR 2+V*R*
1 series · 4 of 4 positions shown · non-contrast
Comparison: None.

CLINICAL DATA: Right leg pain without known injury.

EXAM:
RIGHT FEMUR 2 VIEWS

[Series 1: view not recorded · 0.14mm/px · 4 of 4 slices shown]
[im 1/4]
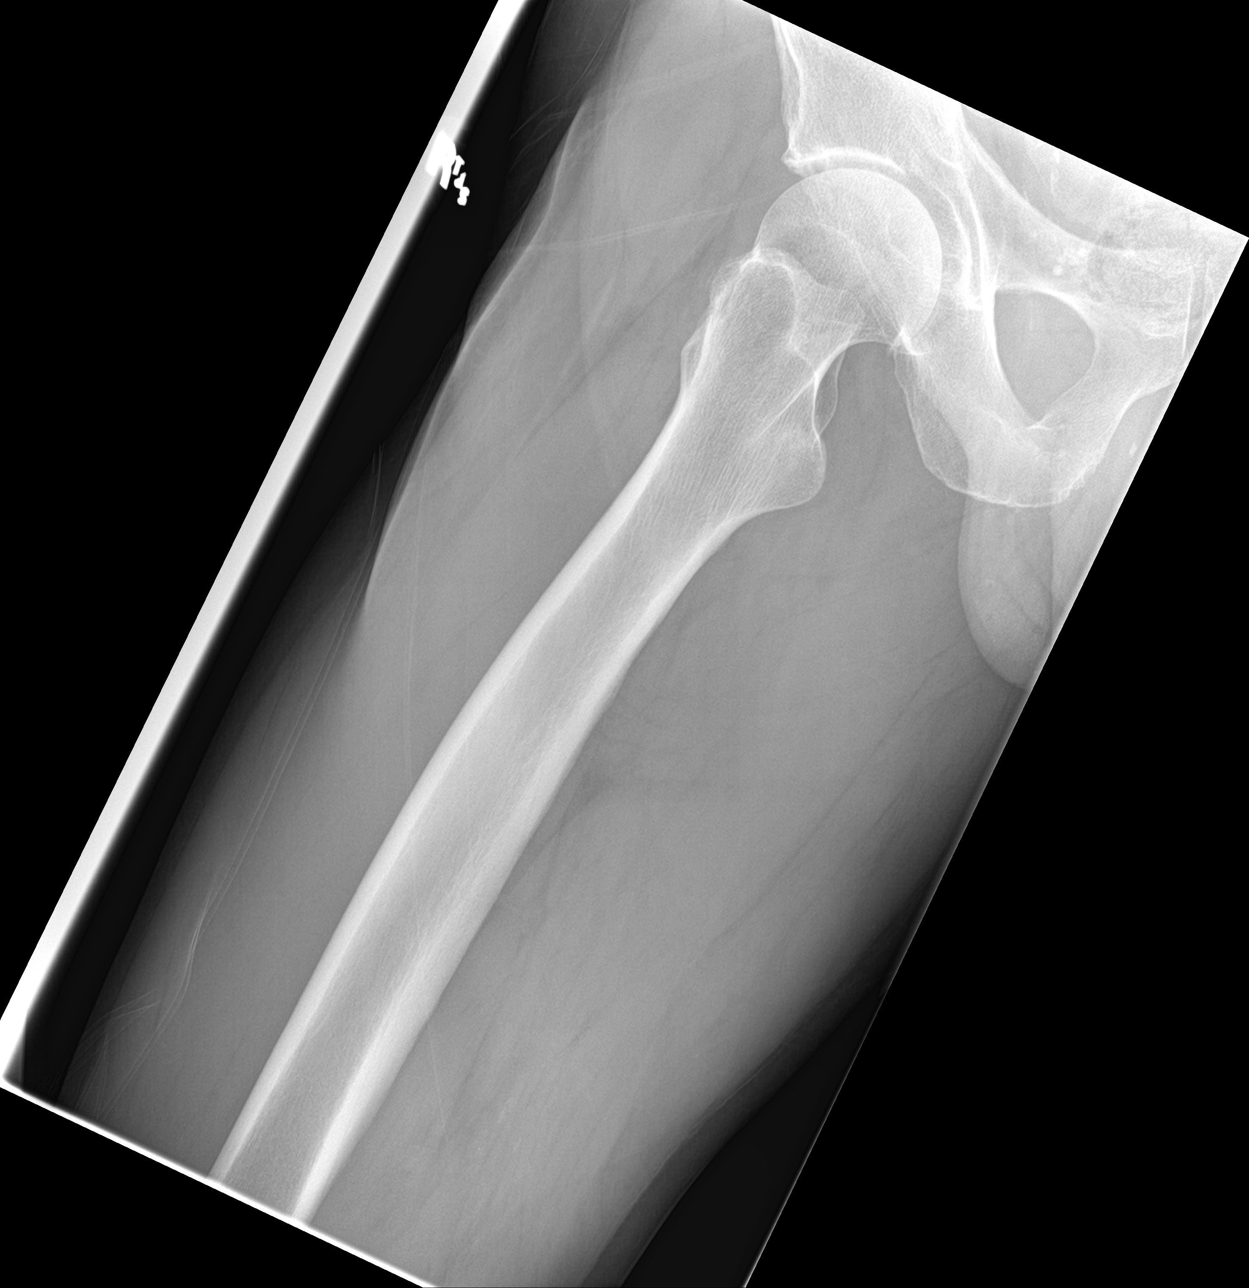
[im 2/4]
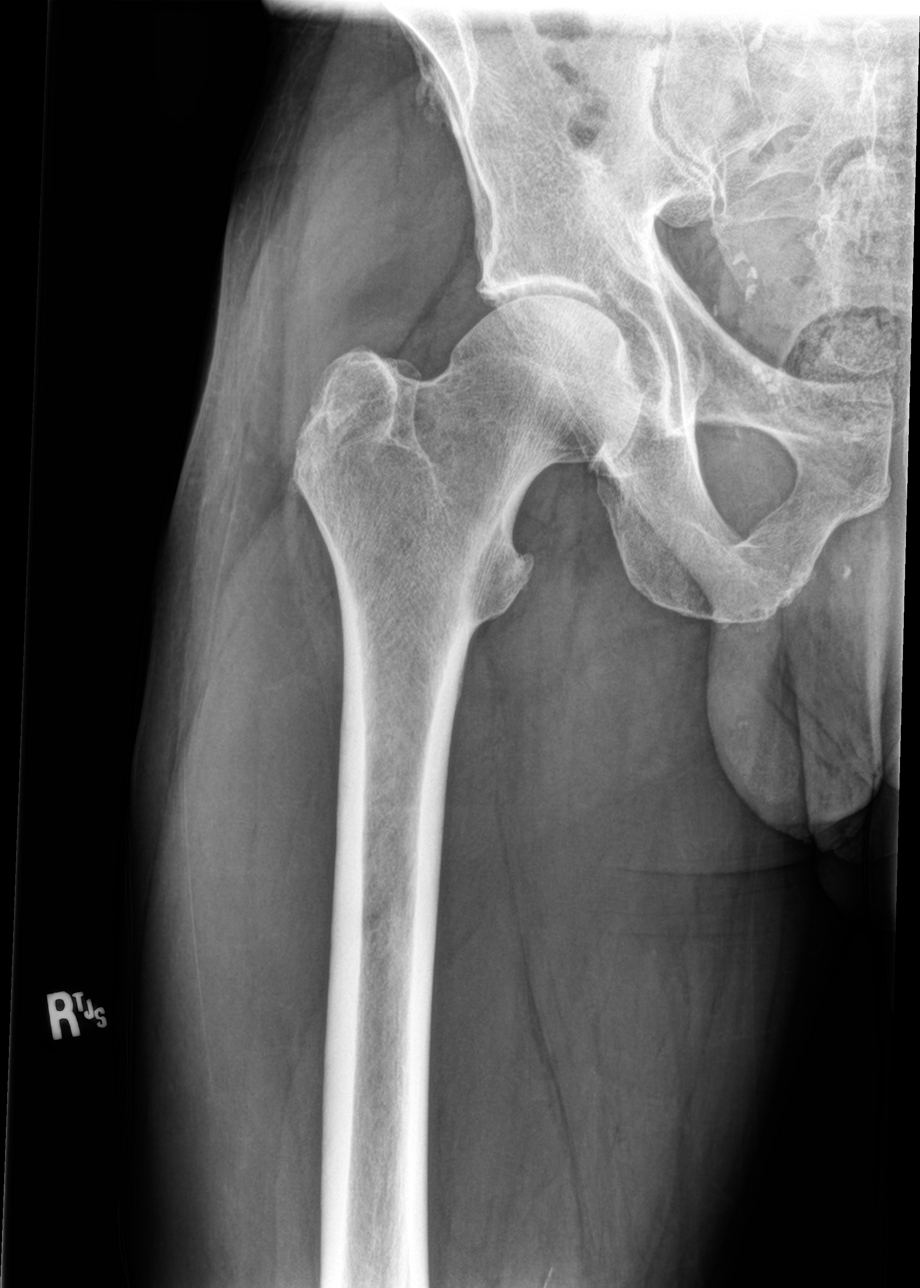
[im 3/4]
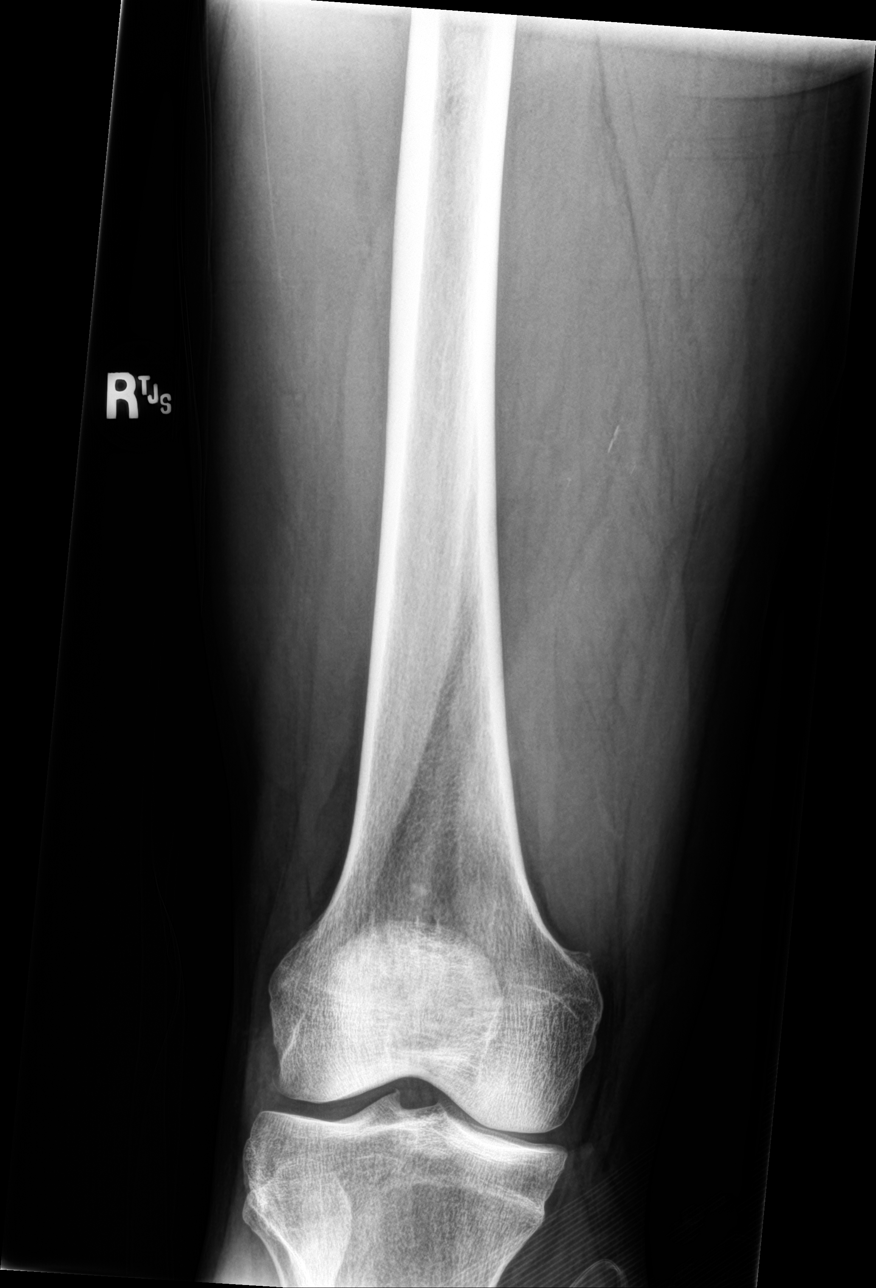
[im 4/4]
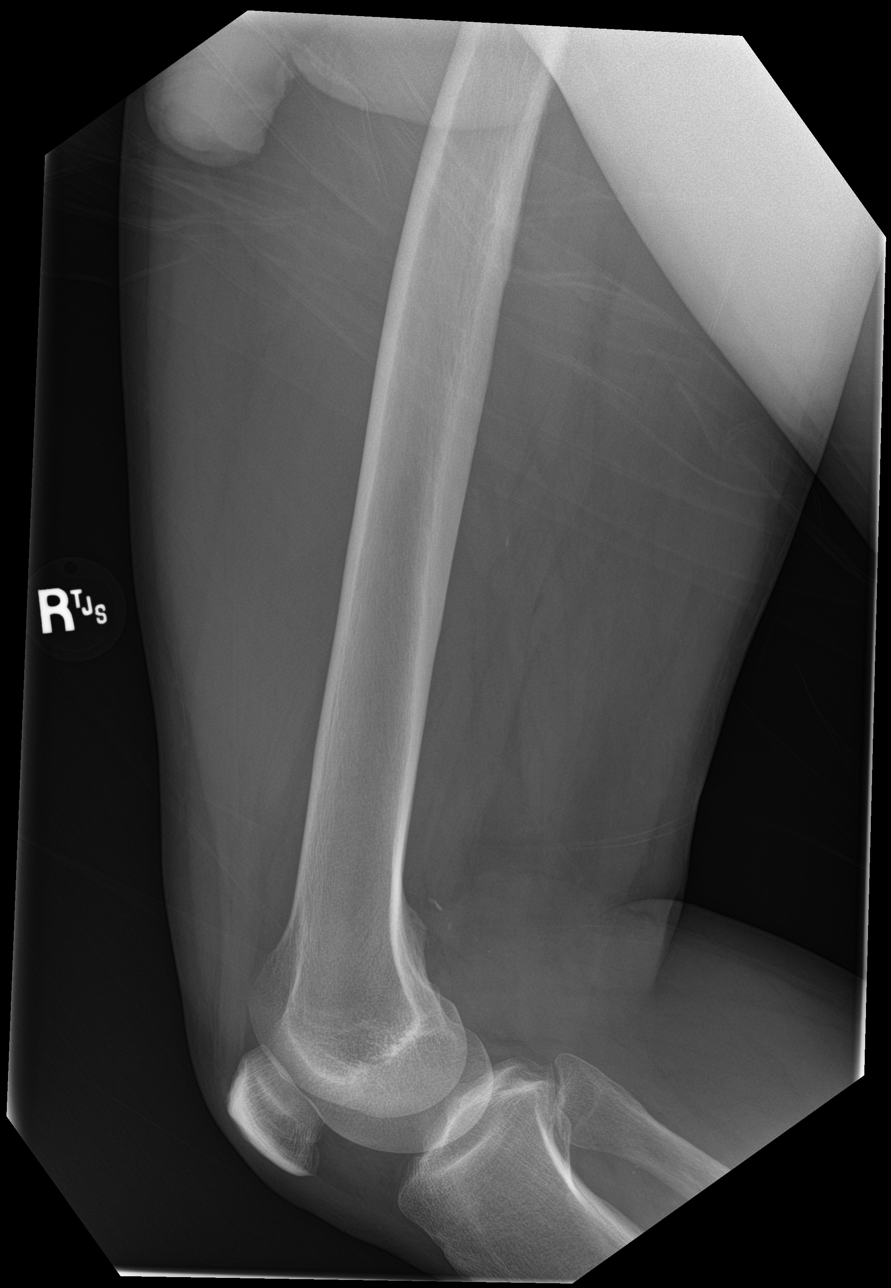

[4 of 4 positions shown; findings below may reference images not displayed]

FINDINGS: There is no evidence of fracture or other focal bone lesions. Soft
tissues are unremarkable.
IMPRESSION: Negative.

## 2022-03-10 IMAGING — CR DG HIP (WITH OR WITHOUT PELVIS) 2-3V*R*
1 series · 3 of 3 positions shown · non-contrast
Comparison: None.

CLINICAL DATA: Right hip pain for 1 month without known injury.

EXAM:
DG HIP (WITH OR WITHOUT PELVIS) 2-3V RIGHT

[Series 1: dg hip unilat w or w/o pelvis 2-3 views  · non-contrast · 0.14mm/px · 3 of 3 slices shown]
[im 1/3]
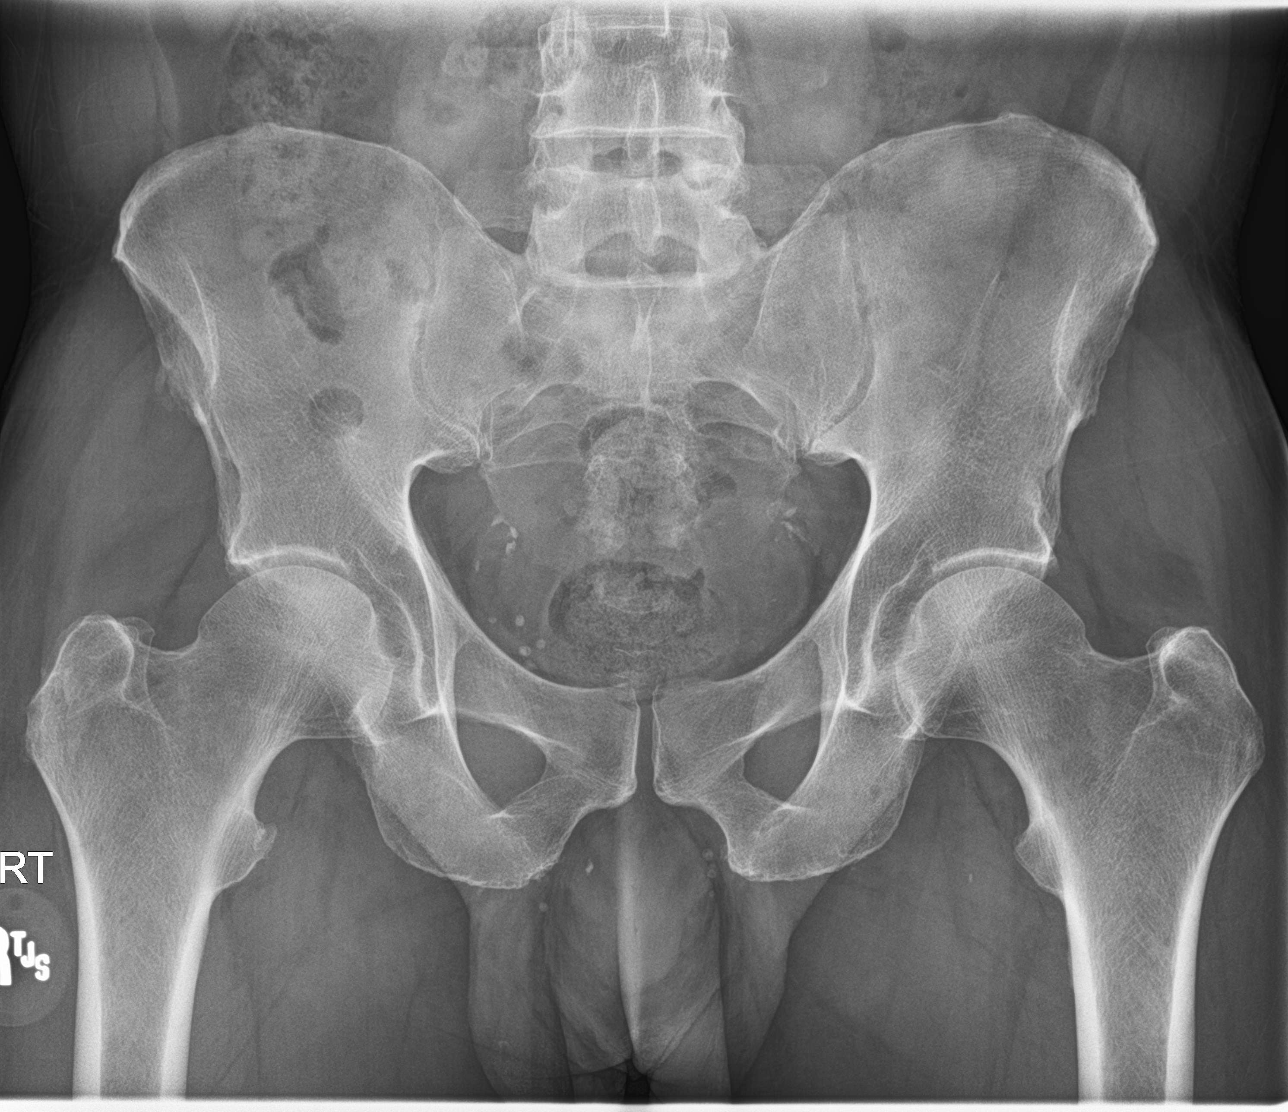
[im 2/3]
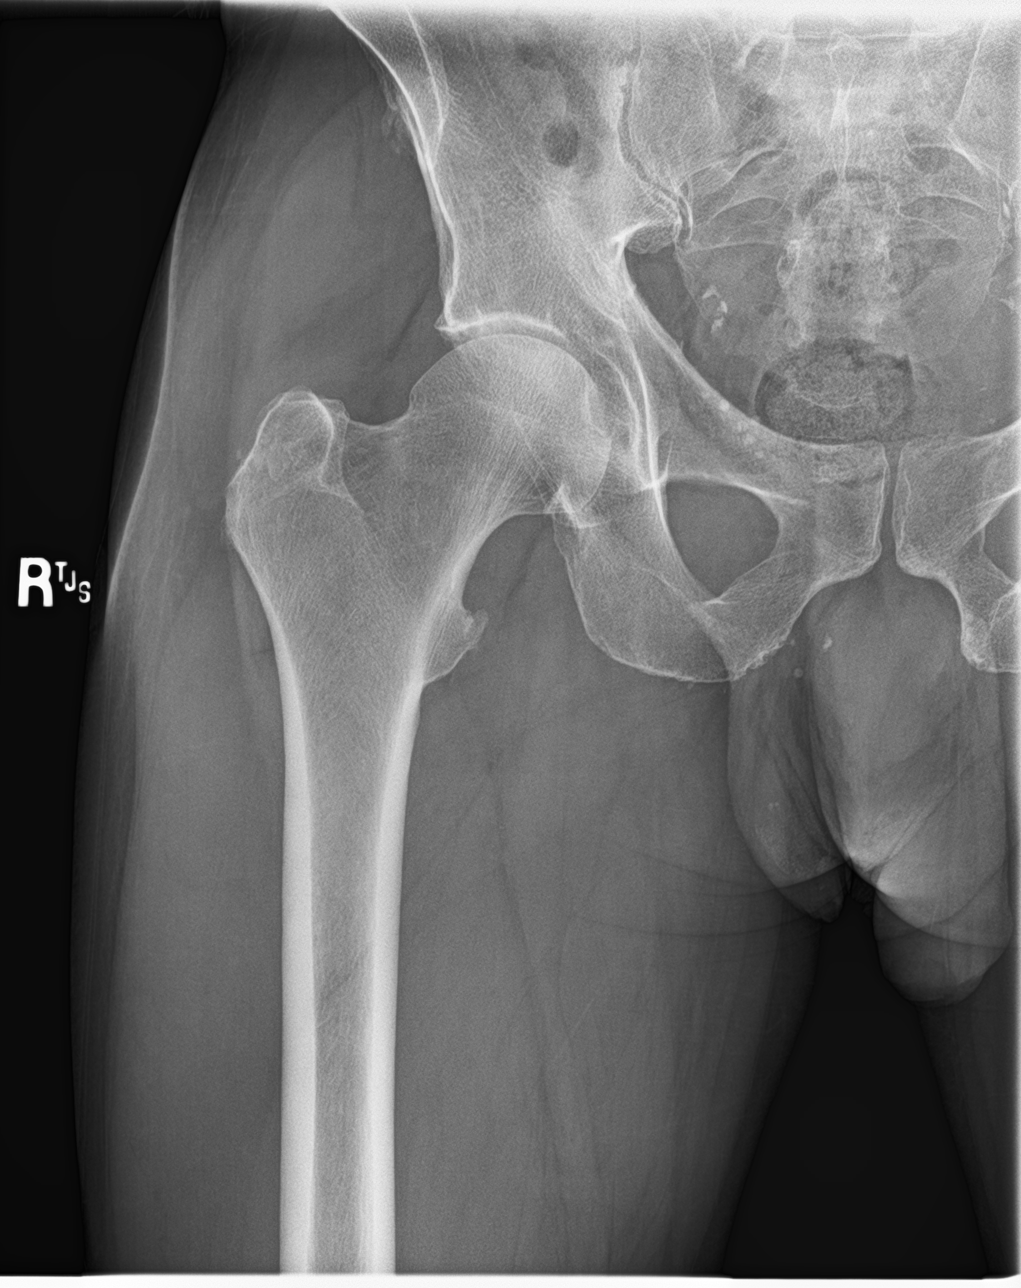
[im 3/3]
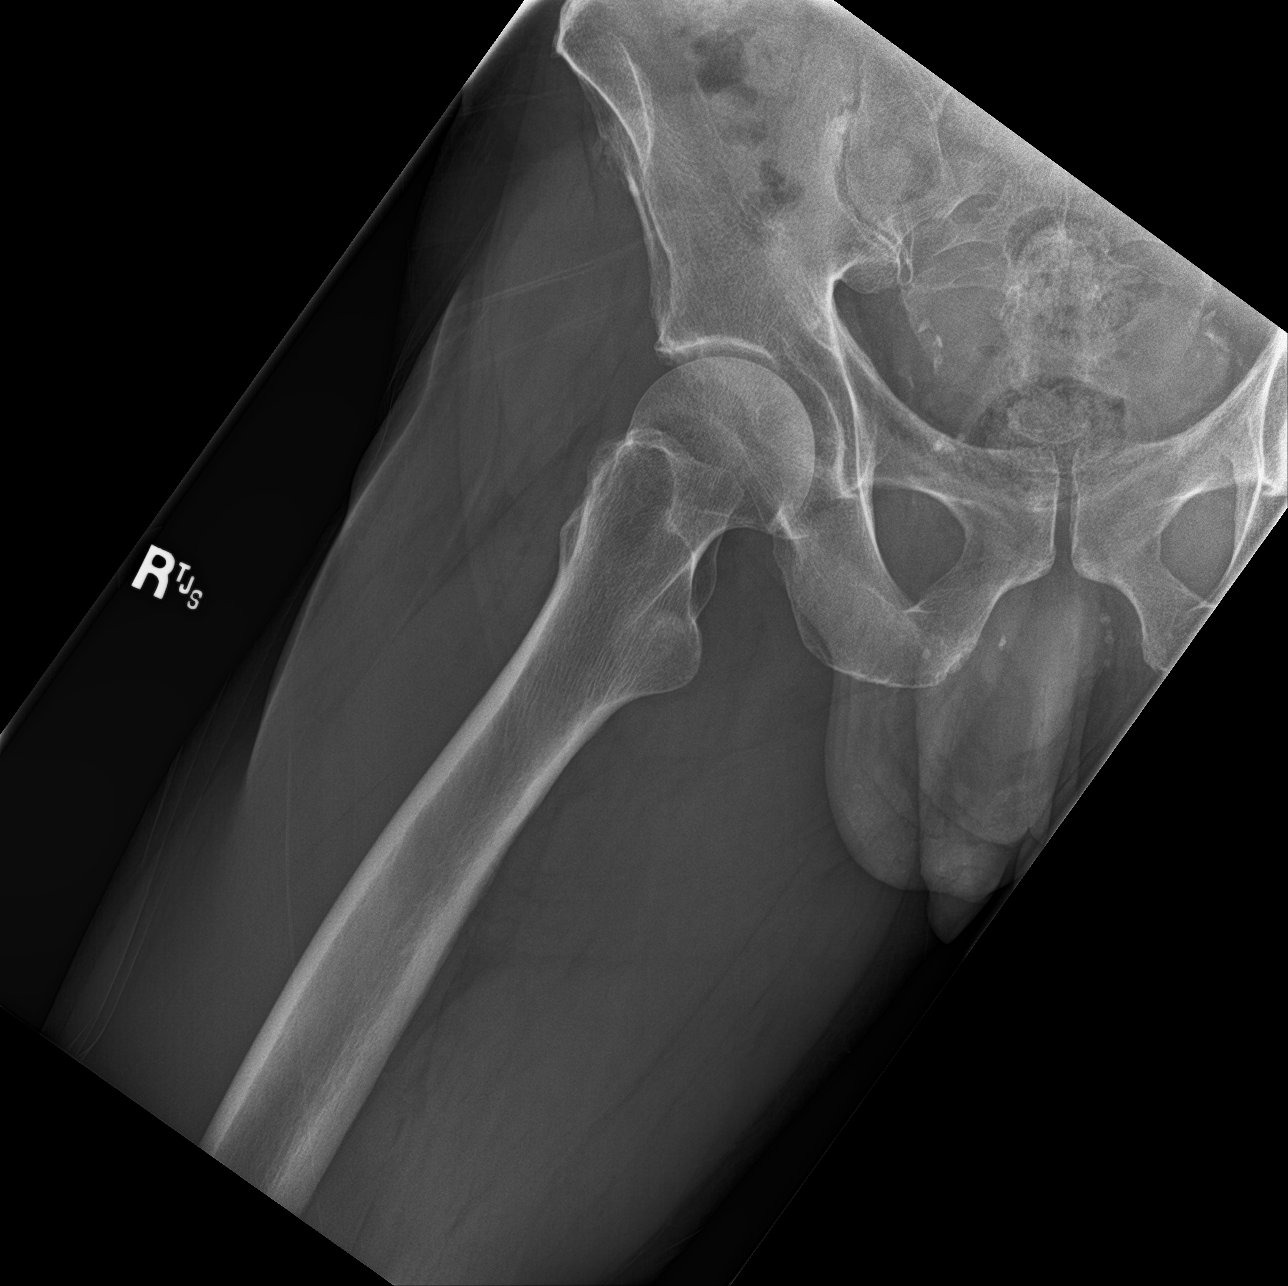

[3 of 3 positions shown; findings below may reference images not displayed]

FINDINGS: There is no evidence of hip fracture or dislocation. There is no
evidence of arthropathy or other focal bone abnormality.
IMPRESSION: Negative.

## 2022-10-23 ENCOUNTER — Ambulatory Visit
Admission: EM | Admit: 2022-10-23 | Discharge: 2022-10-23 | Disposition: A | Discharge status: Home / Self Care | Payer: BC Managed Care – PPO | Attending: Emergency Medicine | Admitting: Emergency Medicine

## 2022-10-23 DIAGNOSIS — J020 Streptococcal pharyngitis: Secondary | ICD-10-CM | POA: Diagnosis not present

## 2022-10-23 LAB — GROUP A STREP BY PCR: Group A Strep by PCR: DETECTED — AB

## 2022-10-23 MED ORDER — AMOXICILLIN-POT CLAVULANATE 875-125 MG PO TABS: 1.0000 | ORAL_TABLET | Freq: Two times a day (BID) | ORAL | 0 refills | Status: AC

## 2022-10-23 NOTE — ED Triage Notes (Signed)
Pt c/o sore throat x1 day, denies any fevers,chills or bodyaches.

## 2022-10-23 NOTE — Discharge Instructions (Signed)
Take the Augmentin twice daily for 10 days for treatment of your strep throat.  Gargle with warm salt water 2-3 times a day to soothe your throat, aid in pain relief, and aid in healing.  Take over-the-counter ibuprofen according to the package instructions as needed for pain.  You can also use Chloraseptic or Sucrets lozenges, 1 lozenge every 2 hours as needed for throat pain.  If you develop any new or worsening symptoms return for reevaluation.

## 2022-10-23 NOTE — ED Provider Notes (Signed)
[facility]    CSN: [id number] Arrival date & time: 10/23/22  1234      History   Chief Complaint Chief Complaint  Patient presents with   Sore Throat    HPI Grant Golden is a 62 y.o. male.   HPI  62 year old male here for evaluation of sore throat.  The patient has a significant past medical history for hypertension and hyperlipidemia presenting for evaluation of sore throat which began yesterday.  He states that Sunday he felt a little achy but he has not measured a fever at home.  He has had some mild nasal congestion and is also complaining of pain in his left ear.  He denies cough or GI symptoms.  Past Medical History:  Diagnosis Date   Allergic rhinitis    Arthritis    shoulders   Hyperlipidemia    Hypertension    Wears dentures    partail upper and lower    Patient Active Problem List   Diagnosis Date Noted   Essential hypertension 10/27/2019   Allergic rhinitis 09/14/2015   Cervical spinal stenosis 09/14/2015   Hypercholesterolemia 09/14/2015    Past Surgical History:  Procedure Laterality Date   COLONOSCOPY  07/07/2010   entire colon was normal repeat in 86yr   HERNIA REPAIR Left        Home Medications    Prior to Admission medications   Medication Sig Start Date End Date Taking? Authorizing Provider  amoxicillin-clavulanate (AUGMENTIN) 875-125 MG tablet Take 1 tablet by mouth every 12 (twelve) hours for 10 days. 10/23/22 11/02/22 Yes RMargarette Canada NP  chlorhexidine (PGarza 0.12 % solution SMARTSIG:0.5 Ounce(s) By Mouth Twice Daily 06/27/20  Yes [provider]  losartan-hydrochlorothiazide (HYZAAR) 50-12.5 MG tablet Take 1 tablet by mouth in the morning and at bedtime. 07/18/20 10/23/22 Yes Pollak, AWendee Beavers PA-C  meloxicam (MOBIC) 7.5 MG tablet TAKE 1 TABLET(7.5 MG) BY MOUTH DAILY 03/09/20  Yes PCarles ColletM, PA-C  polyethylene glycol powder (GLYCOLAX/MIRALAX) 17 GM/SCOOP powder 17 g once or twice daily for  constipation. 05/10/21  Yes CCoral Spikes DO    Family History Family History  Problem Relation Age of Onset   Arthritis Father    Heart Problems Father    Heart disease Sister    Heart Problems Brother     Social History Social History   Tobacco Use   Smoking status: Former    Types: Cigarettes    Quit date: 2001    Years since quitting: 23.1   Smokeless tobacco: Never   Tobacco comments:    Quit smoking back in 2001  Vaping Use   Vaping Use: Never used  Substance Use Topics   Alcohol use: Yes    Alcohol/week: 0.0 standard drinks of alcohol    Comment: occasional   Drug use: No     Allergies   Patient has no known allergies.   Review of Systems Review of Systems  Constitutional:  Negative for fever.  HENT:  Positive for congestion, ear pain and sore throat.   Respiratory:  Negative for cough.   Gastrointestinal:  Negative for diarrhea, nausea and vomiting.  Musculoskeletal:  Positive for arthralgias.     Physical Exam Triage Vital Signs ED Triage Vitals  Enc Vitals Group     BP 10/23/22 1306 (!) 156/86     Pulse Rate 10/23/22 1306 92     Resp 10/23/22 1306 16     Temp 10/23/22 1306 100.1 F (37.8 C)  Temp Source 10/23/22 1306 Oral     SpO2 10/23/22 1306 99 %     Weight 10/23/22 1306 175 lb (79.4 kg)     Height 10/23/22 1306 '5\' 8"'$  (1.727 m)     Head Circumference --      Peak Flow --      Pain Score 10/23/22 1305 9     Pain Loc --      Pain Edu? --      Excl. in [place]? --    No data found.  Updated Vital Signs BP (!) 156/86 (BP Location: Left Arm)   Pulse 92   Temp 100.1 F (37.8 C) (Oral)   Resp 16   Ht '5\' 8"'$  (1.727 m)   Wt 175 lb (79.4 kg)   SpO2 99%   BMI 26.61 kg/m   Visual Acuity Right Eye Distance:   Left Eye Distance:   Bilateral Distance:    Right Eye Near:   Left Eye Near:    Bilateral Near:     Physical Exam Vitals and nursing note reviewed.  Constitutional:      Appearance: Normal appearance. He is not  ill-appearing.  HENT:     Head: Normocephalic and atraumatic.     Right Ear: Tympanic membrane, ear canal and external ear normal. There is no impacted cerumen.     Left Ear: Tympanic membrane, ear canal and external ear normal. There is no impacted cerumen.     Nose: Nose normal. No congestion or rhinorrhea.     Mouth/Throat:     Mouth: Mucous membranes are moist.     Pharynx: Oropharynx is clear. Posterior oropharyngeal erythema present. No oropharyngeal exudate.  Musculoskeletal:     Cervical back: Normal range of motion and neck supple.  Lymphadenopathy:     Cervical: No cervical adenopathy.  Skin:    General: Skin is warm and dry.     Capillary Refill: Capillary refill takes less than 2 seconds.     Findings: No rash.  Neurological:     General: No focal deficit present.     Mental Status: He is alert and oriented to person, place, and time.      UC Treatments / Results  Labs (all labs ordered are listed, but only abnormal results are displayed) Labs Reviewed  GROUP A STREP BY PCR - Abnormal; Notable for the following components:      Result Value   Group A Strep by PCR DETECTED (*)    All other components within normal limits    EKG   Radiology No results found.  Procedures Procedures (including critical care time)  Medications Ordered in UC Medications - No data to display  Initial Impression / Assessment and Plan / UC Course  I have reviewed the triage vital signs and the nursing notes.  Pertinent labs & imaging results that were available during my care of the patient were reviewed by me and considered in my medical decision making (see chart for details).   Patient is a pleasant, nontoxic-appearing 27 old male here for evaluation of sore throat began yesterday.  He did not measure fever at home but states that the day before his third began he did experience some achiness in her joints.  He does have an elevated temp of 100.1 here in clinic.  On exam he  has erythema and edema of his posterior oropharynx with injection.  Tonsillar pillars are unremarkable.  No exudate noted.  Strep PCR was collected at triage and is  positive.  I will discharge patient home with diagnosis of strep pharyngitis and start him on Augmentin 875 twice daily for 10 days.  Over-the-counter Tylenol and ibuprofen as needed for fever and pain.  Work note provided.   Final Clinical Impressions(s) / UC Diagnoses   Final diagnoses:  Strep pharyngitis     Discharge Instructions      Take the Augmentin twice daily for 10 days for treatment of your strep throat.  Gargle with warm salt water 2-3 times a day to soothe your throat, aid in pain relief, and aid in healing.  Take over-the-counter ibuprofen according to the package instructions as needed for pain.  You can also use Chloraseptic or Sucrets lozenges, 1 lozenge every 2 hours as needed for throat pain.  If you develop any new or worsening symptoms return for reevaluation.      ED Prescriptions     Medication Sig Dispense Auth. Provider   amoxicillin-clavulanate (AUGMENTIN) 875-125 MG tablet Take 1 tablet by mouth every 12 (twelve) hours for 10 days. 20 tablet Margarette Canada, NP      PDMP not reviewed this encounter.   Margarette Canada, NP 10/23/22 1351

## 2022-12-19 ENCOUNTER — Ambulatory Visit
Admission: EM | Admit: 2022-12-19 | Discharge: 2022-12-19 | Disposition: A | Discharge status: Home / Self Care | Payer: BC Managed Care – PPO

## 2022-12-19 ENCOUNTER — Encounter: Payer: Self-pay | Admitting: Emergency Medicine

## 2022-12-19 DIAGNOSIS — H9202 Otalgia, left ear: Secondary | ICD-10-CM

## 2022-12-19 DIAGNOSIS — M26622 Arthralgia of left temporomandibular joint: Secondary | ICD-10-CM | POA: Diagnosis not present

## 2022-12-19 MED ORDER — CYCLOBENZAPRINE HCL 10 MG PO TABS: 10.0000 mg | ORAL_TABLET | Freq: Every evening | ORAL | 0 refills | Status: AC | PRN

## 2022-12-19 MED ORDER — MELOXICAM 15 MG PO TABS: 15.0000 mg | ORAL_TABLET | Freq: Every day | ORAL | 1 refills | Status: AC

## 2022-12-19 NOTE — Discharge Instructions (Addendum)
-  Discomfort is likely caused by TMJ arthralgia.  See the handout. - I sent an anti-inflammatory medicine for you to start.  He can also take Tylenol. - I sent a muscle relaxer for you to use at bedtime. - You may ice your jaw.  Try to avoid any repetitive movements of your jaw such as chewing on really hard food, frequent yawning or jaw clenching. - Return if worsening symptoms. - Follow-up with PCP if no improvement in the next couple weeks.

## 2022-12-19 NOTE — ED Triage Notes (Signed)
Pt presents with left ear pain x 1 week. 

## 2022-12-19 NOTE — ED Provider Notes (Signed)
[facility]    CSN: [id number] Arrival date & time: 12/19/22  1552      History   Chief Complaint Chief Complaint  Patient presents with   Otalgia    HPI Grant Golden is a 62 y.o. male presenting for approximately 1 week history of left-sided jaw and ear pain.  Patient reports pain in the left ear when opening mouth widely, when chewing, sometimes when yawning.  Occasionally reports pain when swallowing.  Reports that he had strep a couple months ago and the pain he feels in his ear feels sharp and like when he had strep.  He denies any pain in his throat, fever, cough, congestion, ear drainage.  Has taken Motrin and says that has been somewhat helpful at times.  HPI  Past Medical History:  Diagnosis Date   Allergic rhinitis    Arthritis    shoulders   Hyperlipidemia    Hypertension    Wears dentures    partail upper and lower    Patient Active Problem List   Diagnosis Date Noted   Essential hypertension 10/27/2019   Allergic rhinitis 09/14/2015   Cervical spinal stenosis 09/14/2015   Hypercholesterolemia 09/14/2015    Past Surgical History:  Procedure Laterality Date   COLONOSCOPY  07/07/2010   entire colon was normal repeat in 57yrs   HERNIA REPAIR Left        Home Medications    Prior to Admission medications   Medication Sig Start Date End Date Taking? Authorizing Provider  cyclobenzaprine (FLEXERIL) 10 MG tablet Take 1 tablet (10 mg total) by mouth at bedtime as needed for up to 15 days for muscle spasms. 12/19/22 01/03/23 Yes Shirlee Latch, PA-C  meloxicam (MOBIC) 15 MG tablet Take 1 tablet (15 mg total) by mouth daily for 15 days. 12/19/22 01/03/23 Yes Eusebio Friendly B, PA-C  testosterone cypionate (DEPOTESTOSTERONE CYPIONATE) 200 MG/ML injection SMARTSIG:Milliliter(s) IM 09/17/22  Yes [provider]  chlorhexidine (PERIDEX) 0.12 % solution SMARTSIG:0.5 Ounce(s) By Mouth Twice Daily 06/27/20   [provider]   losartan-hydrochlorothiazide (HYZAAR) 50-12.5 MG tablet Take 1 tablet by mouth in the morning and at bedtime. 07/18/20 10/23/22  Trey Sailors, PA-C  polyethylene glycol powder (GLYCOLAX/MIRALAX) 17 GM/SCOOP powder 17 g once or twice daily for constipation. 05/10/21   Tommie Sams, DO    Family History Family History  Problem Relation Age of Onset   Arthritis Father    Heart Problems Father    Heart disease Sister    Heart Problems Brother     Social History Social History   Tobacco Use   Smoking status: Former    Types: Cigarettes    Quit date: 2001    Years since quitting: 23.3   Smokeless tobacco: Never   Tobacco comments:    Quit smoking back in 2001  Vaping Use   Vaping Use: Never used  Substance Use Topics   Alcohol use: Yes    Alcohol/week: 0.0 standard drinks of alcohol    Comment: occasional   Drug use: No     Allergies   Patient has no known allergies.   Review of Systems Review of Systems  Constitutional:  Negative for fatigue and fever.  HENT:  Positive for ear pain. Negative for congestion, ear discharge, hearing loss, rhinorrhea, sinus pressure, sinus pain and sore throat.   Respiratory:  Negative for cough.   Musculoskeletal:  Negative for neck pain.  Neurological:  Negative for weakness, light-headedness and headaches.  Hematological:  Negative for adenopathy.     Physical Exam Triage Vital Signs ED Triage Vitals  Enc Vitals Group     BP      Pulse      Resp      Temp      Temp src      SpO2      Weight      Height      Head Circumference      Peak Flow      Pain Score      Pain Loc      Pain Edu?      Excl. in [place]?    No data found.  Updated Vital Signs BP (!) 144/89 (BP Location: Right Arm)   Pulse 89   Temp 98.2 F (36.8 C) (Oral)   Resp 16   SpO2 97%      Physical Exam Vitals and nursing note reviewed.  Constitutional:      General: He is not in acute distress.    Appearance: Normal appearance. He is  well-developed. He is not ill-appearing.  HENT:     Head: Normocephalic and atraumatic.     Jaw: Tenderness (TTP left TMJ) and pain on movement present. No swelling.     Comments: No clicking or popping of jaw    Right Ear: Tympanic membrane, ear canal and external ear normal.     Left Ear: Tympanic membrane, ear canal and external ear normal.     Nose: Nose normal.     Mouth/Throat:     Mouth: Mucous membranes are moist.     Pharynx: Oropharynx is clear.  Eyes:     General: No scleral icterus.    Conjunctiva/sclera: Conjunctivae normal.  Cardiovascular:     Rate and Rhythm: Normal rate and regular rhythm.     Heart sounds: Normal heart sounds.  Pulmonary:     Effort: Pulmonary effort is normal. No respiratory distress.     Breath sounds: Normal breath sounds.  Musculoskeletal:     Cervical back: Neck supple.  Skin:    General: Skin is warm and dry.     Capillary Refill: Capillary refill takes less than 2 seconds.  Neurological:     General: No focal deficit present.     Mental Status: He is alert. Mental status is at baseline.     Motor: No weakness.     Gait: Gait normal.  Psychiatric:        Mood and Affect: Mood normal.        Behavior: Behavior normal.      UC Treatments / Results  Labs (all labs ordered are listed, but only abnormal results are displayed) Labs Reviewed - No data to display  EKG   Radiology No results found.  Procedures Procedures (including critical care time)  Medications Ordered in UC Medications - No data to display  Initial Impression / Assessment and Plan / UC Course  I have reviewed the triage vital signs and the nursing notes.  Pertinent labs & imaging results that were available during my care of the patient were reviewed by me and considered in my medical decision making (see chart for details).   62 year old male presents for left jaw and ear pain for the past week.  Reports pain is mostly when opening jaw overly widely,  repetitive chewing, swallowing.  On exam he has no evidence of an ear infection.  Throat is clear.  He does have tenderness to palpation of  the left TMJ but no clicking, locking or popping.  Presentation is most consistent with TMJ arthralgia.  Sent Mobic and cyclobenzaprine for patient.  Advised following RICE guidelines.  Advised he can also use Tylenol for pain relief.  Reviewed return precautions.   Final Clinical Impressions(s) / UC Diagnoses   Final diagnoses:  Arthralgia of left temporomandibular joint  Otalgia of left ear     Discharge Instructions      -Discomfort is likely caused by TMJ arthralgia.  See the handout. - I sent an anti-inflammatory medicine for you to start.  He can also take Tylenol. - I sent a muscle relaxer for you to use at bedtime. - You may ice your jaw.  Try to avoid any repetitive movements of your jaw such as chewing on really hard food, frequent yawning or jaw clenching. - Return if worsening symptoms. - Follow-up with PCP if no improvement in the next couple weeks.     ED Prescriptions     Medication Sig Dispense Auth. Provider   meloxicam (MOBIC) 15 MG tablet Take 1 tablet (15 mg total) by mouth daily for 15 days. 15 tablet Eusebio Friendly B, PA-C   cyclobenzaprine (FLEXERIL) 10 MG tablet Take 1 tablet (10 mg total) by mouth at bedtime as needed for up to 15 days for muscle spasms. 15 tablet Gareth Morgan      PDMP not reviewed this encounter.   Shirlee Latch, PA-C 12/19/22 1737

## 2023-10-24 ENCOUNTER — Telehealth: Payer: Self-pay

## 2023-10-24 NOTE — Telephone Encounter (Signed)
 Copied from CRM [id number]. Topic: Clinical - Medication Question >> Oct 24, 2023  9:48 AM Carlatta H wrote: Reason for CRM: Patient would like a call back regarding chest congestion//Please call patient

## 2023-10-24 NOTE — Telephone Encounter (Signed)
 Patient has made an appointment

## 2023-12-04 ENCOUNTER — Ambulatory Visit: Admitting: Physician Assistant

## 2023-12-05 ENCOUNTER — Encounter: Payer: Self-pay | Admitting: Family Medicine

## 2023-12-05 ENCOUNTER — Ambulatory Visit: Admitting: Family Medicine

## 2023-12-05 VITALS — BP 170/118 | HR 80 | Temp 97.9°F | Resp 16 | Ht 67.0 in | Wt 191.9 lb

## 2023-12-05 DIAGNOSIS — Z114 Encounter for screening for human immunodeficiency virus [HIV]: Secondary | ICD-10-CM

## 2023-12-05 DIAGNOSIS — Z7689 Persons encountering health services in other specified circumstances: Secondary | ICD-10-CM

## 2023-12-05 DIAGNOSIS — G8929 Other chronic pain: Secondary | ICD-10-CM

## 2023-12-05 DIAGNOSIS — E66811 Obesity, class 1: Secondary | ICD-10-CM

## 2023-12-05 DIAGNOSIS — Z13220 Encounter for screening for lipoid disorders: Secondary | ICD-10-CM

## 2023-12-05 DIAGNOSIS — I10 Essential (primary) hypertension: Secondary | ICD-10-CM

## 2023-12-05 DIAGNOSIS — I253 Aneurysm of heart: Secondary | ICD-10-CM

## 2023-12-05 DIAGNOSIS — Z130 Encounter for screening for diseases of the blood and blood-forming organs and certain disorders involving the immune mechanism: Secondary | ICD-10-CM

## 2023-12-05 DIAGNOSIS — Z1211 Encounter for screening for malignant neoplasm of colon: Secondary | ICD-10-CM

## 2023-12-05 DIAGNOSIS — M25512 Pain in left shoulder: Secondary | ICD-10-CM | POA: Diagnosis not present

## 2023-12-05 DIAGNOSIS — E785 Hyperlipidemia, unspecified: Secondary | ICD-10-CM

## 2023-12-05 DIAGNOSIS — I358 Other nonrheumatic aortic valve disorders: Secondary | ICD-10-CM

## 2023-12-05 DIAGNOSIS — Z23 Encounter for immunization: Secondary | ICD-10-CM | POA: Diagnosis not present

## 2023-12-05 MED ORDER — HYDROCHLOROTHIAZIDE 12.5 MG PO CAPS: 12.5000 mg | ORAL_CAPSULE | Freq: Every day | ORAL | 0 refills | Status: DC

## 2023-12-05 MED ORDER — HYDROCHLOROTHIAZIDE 12.5 MG PO CAPS
12.5000 mg | ORAL_CAPSULE | Freq: Every day | ORAL | 0 refills | Status: DC
Start: 2023-12-05 — End: 2023-12-05

## 2023-12-05 MED ORDER — MELOXICAM 7.5 MG PO TABS: 7.5000 mg | ORAL_TABLET | Freq: Every day | ORAL | 0 refills | Status: DC | PRN

## 2023-12-05 NOTE — Patient Instructions (Addendum)
  Start taking hydrochlorothiazide 12.5 mg daily.  After the first week, start checking your blood pressures daily and keeping track of them.    If, after two weeks of taking 12.5 daily, they are consistently greater than >130/80, go ahead and start taking 2 capsules daily (at the same time is fine).    Check your blood pressure once daily, and any time you have concerning symptoms like headache, chest pain, dizziness, shortness of breath, or vision changes.   Our goal is less than 130/80.  To appropriately check your blood pressure, make sure you do the following:  1) Avoid caffeine, exercise, or tobacco products for 30 minutes before checking. Empty your bladder. 2) Sit with your back supported in a flat-backed chair. Rest your arm on something flat (arm of the chair, table, etc). 3) Sit still with your feet flat on the floor, resting, for at least 5 minutes.  4) Check your blood pressure. Take 1-2 readings.  5) Write down these readings and bring with you to any provider appointments.  Bring your home blood pressure machine with you to a provider's office for accuracy comparison at least once a year.   Make sure you take your blood pressure medications before you come to any office visit, even if you were asked to fast for labs.

## 2023-12-05 NOTE — Progress Notes (Signed)
 New patient visit   Patient: Grant Golden   DOB: Mar 25, 1961   63 y.o. Male  MRN: [id number] Visit Date: 12/05/2023  Today's healthcare provider: Carlean Charter, DO   Chief Complaint  Patient presents with   Establish Care   Shoulder Pain    Left shoulder   Subjective    Grant Golden is a 63 y.o. male who presents today as a new patient to establish care.  HPI HPI     Shoulder Pain    Additional comments: Left shoulder      Last edited by Verdis Glade, CMA on 12/05/2023  2:34 PM.      Grant Caster "[name]" is a 63 year old male with hypertension who presents for establishing care and management of chronic shoulder pain.  He has a history of hypertension and has been on various medications including amlodipine , losartan , and hydrochlorothiazide . He stopped the medications because they caused dizziness. His blood pressure readings at home have been variable, sometimes reaching as high as 170/118. He has not been on any antihypertensive medication recently and has not seen a healthcare provider for his hypertension in the past few years.  He describes chronic left shoulder pain that began a month or two before his retirement and has progressively worsened. The pain is sharp, more pronounced at night, and makes it difficult for him to lay on that side. He sometimes experiences a popping sound and has difficulty lifting his arm. He uses Biofreeze for relief, which provides inconsistent results.  He works out at [employer] regularly and drinks water during his workouts. He does not smoke and drinks coffee in the morning. He is currently on his wife's insurance, which is associated with [facility].     Past Medical History:  Diagnosis Date   Allergic rhinitis    Arthritis    shoulders   Hyperlipidemia    Hypertension    Wears dentures    partail upper and lower   Past Surgical History:  Procedure Laterality Date   COLONOSCOPY  07/07/2010    entire colon was normal repeat in 23yrs   HERNIA REPAIR Left    Family Status  Relation Name Status   Mother  Deceased   Father  Deceased   Sister  Alive   Brother  Alive   Sister  Alive   Sister  Alive   Brother  Alive   Brother  Alive  No partnership data on file   Family History  Problem Relation Age of Onset   Arthritis Father    Heart Problems Father    Heart disease Sister    Heart Problems Brother    Social History   Socioeconomic History   Marital status: Married    Spouse name: Not on file   Number of children: Not on file   Years of education: Not on file   Highest education level: Not on file  Occupational History   Not on file  Tobacco Use   Smoking status: Former    Current packs/day: 0.00    Types: Cigarettes    Quit date: 2001    Years since quitting: 24.3   Smokeless tobacco: Never   Tobacco comments:    Quit smoking back in 2001  Vaping Use   Vaping status: Never Used  Substance and Sexual Activity   Alcohol use: Yes    Alcohol/week: 12.0 standard drinks of alcohol    Types: 12 Cans of beer per week  Drug use: No   Sexual activity: Not on file  Other Topics Concern   Not on file  Social History Narrative   Not on file   Social Drivers of Health   Financial Resource Strain: Not on file  Food Insecurity: Not on file  Transportation Needs: Not on file  Physical Activity: Not on file  Stress: Not on file  Social Connections: Not on file   Outpatient Medications Prior to Visit  Medication Sig   [DISCONTINUED] chlorhexidine (PERIDEX) 0.12 % solution SMARTSIG:0.5 Ounce(s) By Mouth Twice Daily   [DISCONTINUED] losartan -hydrochlorothiazide  (HYZAAR) 50-12.5 MG tablet Take 1 tablet by mouth in the morning and at bedtime.   [DISCONTINUED] polyethylene glycol powder (GLYCOLAX /MIRALAX ) 17 GM/SCOOP powder 17 g once or twice daily for constipation.   [DISCONTINUED] testosterone cypionate (DEPOTESTOSTERONE CYPIONATE) 200 MG/ML injection  SMARTSIG:Milliliter(s) IM   No facility-administered medications prior to visit.   No Known Allergies  Immunization History  Administered Date(s) Administered   PFIZER(Purple Top)SARS-COV-2 Vaccination 11/05/2019, 11/24/2019   Td 10/29/1997, 04/28/2020   Tdap 02/16/2010   Zoster Recombinant(Shingrix ) 12/05/2023    Health Maintenance  Topic Date Due   Colonoscopy  07/07/2020   COVID-19 Vaccine (3 - 2024-25 season) 05/20/2024 (Originally 04/21/2023)   Zoster Vaccines- Shingrix  (2 of 2) 01/30/2024   INFLUENZA VACCINE  03/20/2024   DTaP/Tdap/Td (4 - Td or Tdap) 04/28/2030   Hepatitis C Screening  Completed   HIV Screening  Completed   HPV VACCINES  Aged Out   Meningococcal B Vaccine  Aged Out    Patient Care Team: Grant Golden, Asencion Blacksmith, DO as PCP - General (Family Medicine) Constancia Delton, MD as PCP - Cardiology (Cardiology)  Review of Systems  Constitutional:  Negative for fatigue.  Respiratory: Negative.  Negative for cough, shortness of breath and wheezing.   Cardiovascular:  Negative for chest pain, palpitations and leg swelling.  Endocrine: Negative for cold intolerance, polydipsia, polyphagia and polyuria.  Genitourinary:  Negative for difficulty urinating, dysuria, frequency, hematuria and urgency.  Neurological:  Negative for weakness, numbness and headaches.        Objective    BP (!) 170/118 (BP Location: Left Arm, Patient Position: Sitting, Cuff Size: Large)   Pulse 80   Temp 97.9 F (36.6 C) (Oral)   Resp 16   Ht 5\' 7"  (1.702 m)   Wt 191 lb 14.4 oz (87 kg)   SpO2 100%   BMI 30.06 kg/m     Physical Exam Vitals and nursing note reviewed.  Constitutional:      General: He is not in acute distress.    Appearance: Normal appearance.  HENT:     Head: Normocephalic and atraumatic.  Eyes:     General: No scleral icterus.    Conjunctiva/sclera: Conjunctivae normal.  Cardiovascular:     Rate and Rhythm: Normal rate.  Pulmonary:     Effort: Pulmonary  effort is normal.  Neurological:     Mental Status: He is alert and oriented to person, place, and time. Mental status is at baseline.  Psychiatric:        Mood and Affect: Mood normal.        Behavior: Behavior normal.     Depression Screen    12/05/2023    3:00 PM 05/05/2019    4:42 PM 10/23/2016    9:44 AM  PHQ 2/9 Scores  PHQ - 2 Score 0 0 0  PHQ- 9 Score 1     Results for orders placed or performed in visit  on 12/05/23  Lipid panel  Result Value Ref Range   Cholesterol, Total 282 (H) 100 - 199 mg/dL   Triglycerides 55 0 - 149 mg/dL   HDL 161 >09 mg/dL   VLDL Cholesterol Cal 8 5 - 40 mg/dL   LDL Chol Calc (NIH) 604 (H) 0 - 99 mg/dL   Chol/HDL Ratio 2.7 0.0 - 5.0 ratio  Comprehensive metabolic panel with GFR  Result Value Ref Range   Glucose 101 (H) 70 - 99 mg/dL   BUN 11 8 - 27 mg/dL   Creatinine, Ser 5.40 0.76 - 1.27 mg/dL   eGFR 81 >98 JX/BJY/7.82   BUN/Creatinine Ratio 11 10 - 24   Sodium 138 134 - 144 mmol/L   Potassium 4.4 3.5 - 5.2 mmol/L   Chloride 100 96 - 106 mmol/L   CO2 22 20 - 29 mmol/L   Calcium  10.1 8.6 - 10.2 mg/dL   Total Protein 7.7 6.0 - 8.5 g/dL   Albumin 4.5 3.9 - 4.9 g/dL   Globulin, Total 3.2 1.5 - 4.5 g/dL   Bilirubin Total 0.7 0.0 - 1.2 mg/dL   Alkaline Phosphatase 118 44 - 121 IU/L   AST 56 (H) 0 - 40 IU/L   ALT 79 (H) 0 - 44 IU/L  Hemoglobin A1c  Result Value Ref Range   Hgb A1c MFr Bld 5.5 4.8 - 5.6 %   Est. average glucose Bld gHb Est-mCnc 111 mg/dL  HIV Antibody (routine testing w rflx)  Result Value Ref Range   HIV Screen 4th Generation wRfx Non Reactive Non Reactive    Assessment & Plan     Establishing care with new doctor, encounter for  Chronic left shoulder pain -     Ambulatory referral to Orthopedic Surgery -     Meloxicam ; Take 1 tablet (7.5 mg total) by mouth daily as needed for pain.  Dispense: 30 tablet; Refill: 0  Essential hypertension -     Lipid panel -     Comprehensive metabolic panel with GFR -      hydroCHLOROthiazide ; Take 1 capsule (12.5 mg total) by mouth daily.  Dispense: 90 capsule; Refill: 0  Obesity (BMI 30.0-34.9) -     Hemoglobin A1c  Encounter for colorectal cancer screening -     Ambulatory referral to Gastroenterology  Screening for endocrine, metabolic and immunity disorder -     Hemoglobin A1c  Screening for lipid disorders -     Lipid panel  Encounter for screening for HIV -     HIV Antibody (routine testing w rflx)  Need for shingles vaccine -     Varicella-zoster vaccine IM     Establishing care with new doctor, encounter for Discussion of vaccinations and screenings. Informed consent for shingles vaccine obtained. - Administer shingles vaccine, first dose, with follow-up for the second dose in 2-6 months. - Recommend COVID booster, available at his local pharmacy. - Screen for HIV as part of routine health maintenance. - Refer for colonoscopy screening. - Routine lab work ordered as noted.  Chronic left Shoulder Pain Chronic left shoulder pain on abduction with probable rotator cuff tear. Referral to orthopedics planned for further evaluation. - Refer to orthopedics for further evaluation. - Prescribe NSAID for pain management, avoiding concurrent use with other NSAIDs. - Consider physical therapy based on orthopedic evaluation.  Essential hypertension Chronic hypertension with recent elevated readings. Previous medications discontinued due to inefficacy and side effects. Plan to manage with hydrochlorothiazide , starting at a low dose. - Prescribe  hydrochlorothiazide  12.5 mg daily for 90 days. - Instruct to check blood pressure daily and keep a log. - If blood pressure remains above 130/80 mmHg after two weeks, increase hydrochlorothiazide  to two capsules daily. - Follow up in four weeks to assess blood pressure control and medication tolerance. - Bring blood pressure log and cuff to the next visit.  Obesity (BMI 30.0-34.9) - Counseled on  lifestyle modifications.    Return in about 3 weeks (around 12/26/2023) for HTN recheck.     I discussed the assessment and treatment plan with the patient  The patient was provided an opportunity to ask questions and all were answered. The patient agreed with the plan and demonstrated an understanding of the instructions.   The patient was advised to call back or seek an in-person evaluation if the symptoms worsen or if the condition fails to improve as anticipated.    Carlean Charter, DO  [facility] [phone] (phone) [phone] (fax)  [facility]

## 2023-12-06 LAB — HEMOGLOBIN A1C
Est. average glucose Bld gHb Est-mCnc: 111 mg/dL
Hgb A1c MFr Bld: 5.5 % (ref 4.8–5.6)

## 2023-12-06 LAB — COMPREHENSIVE METABOLIC PANEL WITH GFR
ALT: 79 IU/L — ABNORMAL HIGH (ref 0–44)
AST: 56 IU/L — ABNORMAL HIGH (ref 0–40)
Albumin: 4.5 g/dL (ref 3.9–4.9)
Alkaline Phosphatase: 118 IU/L (ref 44–121)
BUN/Creatinine Ratio: 11 (ref 10–24)
BUN: 11 mg/dL (ref 8–27)
Bilirubin Total: 0.7 mg/dL (ref 0.0–1.2)
CO2: 22 mmol/L (ref 20–29)
Calcium: 10.1 mg/dL (ref 8.6–10.2)
Chloride: 100 mmol/L (ref 96–106)
Creatinine, Ser: 1.03 mg/dL (ref 0.76–1.27)
Globulin, Total: 3.2 g/dL (ref 1.5–4.5)
Glucose: 101 mg/dL — ABNORMAL HIGH (ref 70–99)
Potassium: 4.4 mmol/L (ref 3.5–5.2)
Sodium: 138 mmol/L (ref 134–144)
Total Protein: 7.7 g/dL (ref 6.0–8.5)
eGFR: 81 mL/min/{1.73_m2} (ref >59)

## 2023-12-06 LAB — HIV ANTIBODY (ROUTINE TESTING W REFLEX): HIV Screen 4th Generation wRfx: NONREACTIVE

## 2023-12-06 LAB — LIPID PANEL
Chol/HDL Ratio: 2.7 ratio (ref 0.0–5.0)
Cholesterol, Total: 282 mg/dL — ABNORMAL HIGH (ref 100–199)
HDL: 104 mg/dL (ref >39)
LDL Chol Calc (NIH): 170 mg/dL — ABNORMAL HIGH (ref 0–99)
Triglycerides: 55 mg/dL (ref 0–149)
VLDL Cholesterol Cal: 8 mg/dL (ref 5–40)

## 2023-12-16 ENCOUNTER — Encounter: Payer: Self-pay | Admitting: Family Medicine

## 2023-12-16 DIAGNOSIS — G8929 Other chronic pain: Secondary | ICD-10-CM | POA: Insufficient documentation

## 2023-12-16 DIAGNOSIS — I358 Other nonrheumatic aortic valve disorders: Secondary | ICD-10-CM | POA: Insufficient documentation

## 2023-12-16 DIAGNOSIS — I253 Aneurysm of heart: Secondary | ICD-10-CM | POA: Insufficient documentation

## 2023-12-16 DIAGNOSIS — E66811 Obesity, class 1: Secondary | ICD-10-CM | POA: Insufficient documentation

## 2023-12-16 MED ORDER — ATORVASTATIN CALCIUM 10 MG PO TABS: 10.0000 mg | ORAL_TABLET | Freq: Every day | ORAL | 1 refills | Status: DC

## 2023-12-26 ENCOUNTER — Encounter (HOSPITAL_COMMUNITY): Payer: Self-pay

## 2024-01-02 ENCOUNTER — Ambulatory Visit: Admitting: Family Medicine

## 2024-01-02 ENCOUNTER — Encounter: Payer: Self-pay | Admitting: Family Medicine

## 2024-01-02 VITALS — BP 140/95 | HR 91 | Resp 16 | Wt 190.7 lb

## 2024-01-02 DIAGNOSIS — I10 Essential (primary) hypertension: Secondary | ICD-10-CM

## 2024-01-02 DIAGNOSIS — Z1212 Encounter for screening for malignant neoplasm of rectum: Secondary | ICD-10-CM | POA: Diagnosis not present

## 2024-01-02 DIAGNOSIS — Z1211 Encounter for screening for malignant neoplasm of colon: Secondary | ICD-10-CM

## 2024-01-02 DIAGNOSIS — R748 Abnormal levels of other serum enzymes: Secondary | ICD-10-CM

## 2024-01-02 MED ORDER — LOSARTAN POTASSIUM 25 MG PO TABS
25.0000 mg | ORAL_TABLET | Freq: Every day | ORAL | 0 refills | Status: DC
Start: 2024-01-02 — End: 2024-04-22

## 2024-01-02 NOTE — Progress Notes (Signed)
 Established patient visit   Patient: Grant Golden   DOB: June 16, 1961   63 y.o. Male  MRN: [id number] Visit Date: 01/02/2024  Today's healthcare provider: Carlean Charter, DO   Chief Complaint  Patient presents with   Medical Management of Chronic Issues    Follow-up HTN BP's readings at home are averaging between 130-140/80's Patients BP cuff in office reading: left arm 151/90 P:96 Wrist Cuff reading: left wrist 147/96 P:50   Subjective    HPI Grant Golden "Grant Golden" is a 63 year old male with hypertension who presents with elevated blood pressure readings.  He has been monitoring his blood pressure at home, noting elevated readings. He typically measures his blood pressure in the morning before eating or drinking, using both a wrist and an arm cuff, with consistent results between the two devices and those taken at the clinic.  He is currently taking hydrochlorothiazide  12.5 mg once daily in the morning. He recalls a previous discussion about potentially increasing the dose if his blood pressure remained elevated, but he has continued with the single dose due to concerns about increased urination. He has a history of being on multiple blood pressure medications, including amlodipine  and losartan , and experienced dizziness with one of these medications, leading to adjustments in his regimen. He is unsure which medication caused the dizziness.  His liver function enzymes were slightly elevated on his last blood work, which was about a month ago.      Medications: Outpatient Medications Prior to Visit  Medication Sig   atorvastatin  (LIPITOR) 10 MG tablet Take 1 tablet (10 mg total) by mouth daily.   hydrochlorothiazide  (MICROZIDE ) 12.5 MG capsule Take 1 capsule (12.5 mg total) by mouth daily.   meloxicam  (MOBIC ) 7.5 MG tablet Take 1 tablet (7.5 mg total) by mouth daily as needed for pain.   No facility-administered medications prior to visit.        Objective     BP (!) 140/95 (BP Location: Right Arm, Patient Position: Sitting, Cuff Size: Normal) Comment: HOME CUFF  Pulse 91   Resp 16   Wt 190 lb 11.2 oz (86.5 kg)   SpO2 100%   BMI 29.87 kg/m     Physical Exam Vitals and nursing note reviewed.  Constitutional:      General: He is not in acute distress.    Appearance: Normal appearance.  HENT:     Head: Normocephalic and atraumatic.  Eyes:     General: No scleral icterus.    Conjunctiva/sclera: Conjunctivae normal.  Cardiovascular:     Rate and Rhythm: Normal rate.  Pulmonary:     Effort: Pulmonary effort is normal.  Neurological:     Mental Status: He is alert and oriented to person, place, and time. Mental status is at baseline.  Psychiatric:        Mood and Affect: Mood normal.        Behavior: Behavior normal.      No results found for any visits on 01/02/24.  Assessment & Plan    Essential hypertension -     Losartan  Potassium; Take 1 tablet (25 mg total) by mouth daily.  Dispense: 90 tablet; Refill: 0  Elevated liver enzymes  Encounter for colorectal cancer screening -     Ambulatory referral to Gastroenterology      Hypertension Current hydrochlorothiazide  12.5 mg insufficient. Prefers alternative to losartan  due to dizziness. - Prescribe losartan  25 mg daily. - Monitor home blood pressure, report side  effects, especially dizziness. - Follow up in 4-6 weeks for blood pressure assessment and blood work.  Elevated liver enzymes Mild elevation noted. Recent finding warrants monitoring. - Recheck liver function tests at next visit, per patient preference to defer.  General Health Maintenance Eligible for pneumonia vaccine. Provided information for consideration.  Referred for colon cancer screening (colonoscopy). - Consider pneumonia vaccination. - Refer for colonoscopy.    Return in about 6 weeks (around 02/13/2024) for HTN.      I discussed the assessment and treatment plan with the patient  The  patient was provided an opportunity to ask questions and all were answered. The patient agreed with the plan and demonstrated an understanding of the instructions.   The patient was advised to call back or seek an in-person evaluation if the symptoms worsen or if the condition fails to improve as anticipated.    Carlean Charter, DO  [facility] [phone] (phone) [phone] (fax)  [facility]

## 2024-01-12 DIAGNOSIS — R748 Abnormal levels of other serum enzymes: Secondary | ICD-10-CM | POA: Insufficient documentation

## 2024-03-01 ENCOUNTER — Other Ambulatory Visit: Payer: Self-pay | Admitting: Family Medicine

## 2024-03-01 DIAGNOSIS — I10 Essential (primary) hypertension: Secondary | ICD-10-CM

## 2024-03-25 ENCOUNTER — Encounter: Payer: Self-pay | Admitting: Family Medicine

## 2024-04-22 ENCOUNTER — Ambulatory Visit: Admitting: Family Medicine

## 2024-04-22 ENCOUNTER — Encounter: Payer: Self-pay | Admitting: Family Medicine

## 2024-04-22 VITALS — BP 156/93 | HR 108 | Ht 67.0 in | Wt 195.0 lb

## 2024-04-22 DIAGNOSIS — R7401 Elevation of levels of liver transaminase levels: Secondary | ICD-10-CM

## 2024-04-22 DIAGNOSIS — N521 Erectile dysfunction due to diseases classified elsewhere: Secondary | ICD-10-CM

## 2024-04-22 DIAGNOSIS — E785 Hyperlipidemia, unspecified: Secondary | ICD-10-CM

## 2024-04-22 DIAGNOSIS — N401 Enlarged prostate with lower urinary tract symptoms: Secondary | ICD-10-CM | POA: Diagnosis not present

## 2024-04-22 DIAGNOSIS — I10 Essential (primary) hypertension: Secondary | ICD-10-CM | POA: Diagnosis not present

## 2024-04-22 DIAGNOSIS — Z125 Encounter for screening for malignant neoplasm of prostate: Secondary | ICD-10-CM

## 2024-04-22 DIAGNOSIS — J4530 Mild persistent asthma, uncomplicated: Secondary | ICD-10-CM

## 2024-04-22 MED ORDER — LOSARTAN POTASSIUM 25 MG PO TABS
25.0000 mg | ORAL_TABLET | Freq: Every day | ORAL | 1 refills | Status: AC
Start: 2024-04-22 — End: ?

## 2024-04-22 MED ORDER — ALBUTEROL SULFATE HFA 108 (90 BASE) MCG/ACT IN AERS
2.0000 | INHALATION_SPRAY | Freq: Four times a day (QID) | RESPIRATORY_TRACT | 0 refills | Status: AC | PRN
Start: 2024-04-22 — End: ?

## 2024-04-22 MED ORDER — TADALAFIL 5 MG PO TABS
5.0000 mg | ORAL_TABLET | Freq: Every day | ORAL | 11 refills | Status: AC
Start: 2024-04-22 — End: ?

## 2024-04-22 MED ORDER — FLUTICASONE FUROATE-VILANTEROL 100-25 MCG/ACT IN AEPB
1.0000 | INHALATION_SPRAY | Freq: Every day | RESPIRATORY_TRACT | 5 refills | Status: DC
Start: 2024-04-22 — End: 2024-05-01

## 2024-04-22 MED ORDER — ATORVASTATIN CALCIUM 10 MG PO TABS
10.0000 mg | ORAL_TABLET | Freq: Every day | ORAL | 1 refills | Status: AC
Start: 2024-04-22 — End: ?

## 2024-04-22 NOTE — Progress Notes (Signed)
 Established patient visit   Patient: Grant Golden   DOB: [birth year]   63 y.o. Male  MRN: [id number] Visit Date: 04/22/2024  Today's healthcare provider: LAURAINE LOISE BUOY, DO   Chief Complaint  Patient presents with   Medication     Discuss possible starting BREO ELLIPTA  inhaler, & erectile dysfunction    Subjective    HPI Grant Golden is a 63 year old male who presents with respiratory symptoms and erectile dysfunction.  He experiences respiratory symptoms, including chest congestion and coughing, which occur almost daily but not throughout the entire day. These symptoms are more frequent in cool environments, such as an air-conditioned house, and improve with activity. He coughs up mucus, particularly at home, and the symptoms can wake him up at night about three times a week. He has a history of smoking from age 70 to his late 50s and reports being told he has allergies. He was previously prescribed an inhaler due to wheezing but stopped using it. He found relief using his daughter's Breo Ellipta  inhaler, which alleviated his symptoms until it ran out. He does not currently use albuterol  or any other inhaler.  He experiences shortness of breath when walking uphill or upstairs, rating it as a 2 to 2.5 on a scale of 0 to 5, with 5 being very breathless. No chest tightness is noted, but the Breo Ellipta  inhaler made him feel like it 'opened everything up'. He has undergone allergy testing in the past, which indicated allergies to weeds and trees, but not to cats.  He reports issues with erectile dysfunction, specifically difficulty maintaining an erection. Urination sometimes takes longer and feels as though he is not fully emptying his bladder. No morning erections.  He monitors his blood pressure at home, noting readings as low as 103 and as high as 125. He occasionally skips his blood pressure medication if his readings are low, which results in a slight increase  in his blood pressure the following day. He has a history of hypertension and is currently on losartan .      Medications: Outpatient Medications Prior to Visit  Medication Sig   [DISCONTINUED] atorvastatin  (LIPITOR) 10 MG tablet Take 1 tablet (10 mg total) by mouth daily.   [DISCONTINUED] hydrochlorothiazide  (MICROZIDE ) 12.5 MG capsule TAKE 1 CAPSULE BY MOUTH EVERY DAY   [DISCONTINUED] losartan  (COZAAR ) 25 MG tablet Take 1 tablet (25 mg total) by mouth daily.   [DISCONTINUED] meloxicam  (MOBIC ) 7.5 MG tablet Take 1 tablet (7.5 mg total) by mouth daily as needed for pain.   No facility-administered medications prior to visit.        Objective    BP (!) 156/93 (BP Location: Left Arm, Patient Position: Sitting, Cuff Size: Normal)   Pulse (!) 108   Ht 5' 7 (1.702 m)   Wt 195 lb (88.5 kg)   SpO2 99%   BMI 30.54 kg/m     Physical Exam Vitals reviewed.  Constitutional:      General: He is not in acute distress.    Appearance: Normal appearance. He is not diaphoretic.  HENT:     Head: Normocephalic and atraumatic.  Eyes:     General: No scleral icterus.    Conjunctiva/sclera: Conjunctivae normal.  Cardiovascular:     Rate and Rhythm: Normal rate and regular rhythm.     Pulses: Normal pulses.     Heart sounds: Normal heart sounds. No murmur heard. Pulmonary:     Effort: Pulmonary effort  is normal. No respiratory distress.     Breath sounds: Normal breath sounds. No wheezing or rhonchi.  Musculoskeletal:     Cervical back: Neck supple.     Right lower leg: No edema.     Left lower leg: No edema.  Lymphadenopathy:     Cervical: No cervical adenopathy.  Skin:    General: Skin is warm and dry.     Findings: No rash.  Neurological:     Mental Status: He is alert and oriented to person, place, and time. Mental status is at baseline.  Psychiatric:        Mood and Affect: Mood normal.        Behavior: Behavior normal.      Results for orders placed or performed in  visit on 04/22/24  Comprehensive metabolic panel with GFR  Result Value Ref Range   Glucose 124 (H) 70 - 99 mg/dL   BUN 11 8 - 27 mg/dL   Creatinine, Ser 8.90 0.76 - 1.27 mg/dL   eGFR 76 >40 fO/fpw/8.26   BUN/Creatinine Ratio 10 10 - 24   Sodium 135 134 - 144 mmol/L   Potassium 4.1 3.5 - 5.2 mmol/L   Chloride 100 96 - 106 mmol/L   CO2 22 20 - 29 mmol/L   Calcium  9.5 8.6 - 10.2 mg/dL   Total Protein 6.9 6.0 - 8.5 g/dL   Albumin 4.2 3.9 - 4.9 g/dL   Globulin, Total 2.7 1.5 - 4.5 g/dL   Bilirubin Total 0.6 0.0 - 1.2 mg/dL   Alkaline Phosphatase 105 44 - 121 IU/L   AST 38 0 - 40 IU/L   ALT 50 (H) 0 - 44 IU/L  Lipid panel  Result Value Ref Range   Cholesterol, Total 250 (H) 100 - 199 mg/dL   Triglycerides 62 0 - 149 mg/dL   HDL 77 >60 mg/dL   VLDL Cholesterol Cal 10 5 - 40 mg/dL   LDL Chol Calc (NIH) 836 (H) 0 - 99 mg/dL   Chol/HDL Ratio 3.2 0.0 - 5.0 ratio  PSA Total (Reflex To Free)  Result Value Ref Range   Prostate Specific Ag, Serum 2.3 0.0 - 4.0 ng/mL   Reflex Criteria Comment     Assessment & Plan    White coat syndrome with hypertension -     Comprehensive metabolic panel with GFR -     Losartan  Potassium; Take 1 tablet (25 mg total) by mouth daily.  Dispense: 90 tablet; Refill: 1  Benign localized prostatic hyperplasia with lower urinary tract symptoms (LUTS) -     Tadalafil ; Take 1 tablet (5 mg total) by mouth daily.  Dispense: 30 tablet; Refill: 11  Erectile dysfunction due to diseases classified elsewhere -     Tadalafil ; Take 1 tablet (5 mg total) by mouth daily.  Dispense: 30 tablet; Refill: 11  Mild persistent asthma without complication -     Albuterol  Sulfate HFA; Inhale 2 puffs into the lungs every 6 (six) hours as needed for wheezing or shortness of breath.  Dispense: 8 g; Refill: 0  Transaminitis -     Comprehensive metabolic panel with GFR  Hyperlipidemia LDL goal <100 -     Lipid panel -     Atorvastatin  Calcium ; Take 1 tablet (10 mg total) by  mouth daily.  Dispense: 90 tablet; Refill: 1  Prostate cancer screening -     PSA Total (Reflex To Free)     Essential hypertension; white coat syndrome Blood pressure well-controlled with home  readings 103-125 mmHg. Occasional missed doses of losartan  reported. - Continue losartan  25 mg daily. - Counseled on medication adherence. - Home BPs controlled with validated cuff.  - Monitor blood pressure at home.   Benign prostatic hyperplasia with lower urinary tract symptoms and erectile dysfunction Difficulty maintaining erection and variable urinary stream suggest benign prostatic hyperplasia. Discussed Cialis  for urinary symptoms and erectile dysfunction, including risk of prolonged erection. - Order prostate-specific antigen (PSA) test. - Prescribe Cialis  to start after PSA results are available.  Mild persistent asthma without complication, with chronic cough and chest congestion Chronic cough and chest congestion improved with daughter's Breo Ellipta . Discussed need for rescue inhaler. - Prescribe Breo Ellipta  for daily use. - Prescribe albuterol  inhaler for rescue use.  Hyperlipidemia LDL goal <100 - Order cholesterol panel.  Transaminitis - Order metabolic panel to recheck liver enzymes.  General Health Maintenance Discussed immunizations and upcoming procedures. Declined flu shot and COVID booster. Second shingles vaccine deferred until after colonoscopy. - Administer second shingles vaccine after colonoscopy. - Consider pneumonia vaccine at a later date.    Return in about 6 months (around 10/20/2024) for Chronic f/u.      I discussed the assessment and treatment plan with the patient  The patient was provided an opportunity to ask questions and all were answered. The patient agreed with the plan and demonstrated an understanding of the instructions.   The patient was advised to call back or seek an in-person evaluation if the symptoms worsen or if the condition fails  to improve as anticipated.    LAURAINE LOISE BUOY, DO  [facility] [phone] (phone) [phone] (fax)  [facility]

## 2024-04-23 LAB — COMPREHENSIVE METABOLIC PANEL WITH GFR
ALT: 50 IU/L — ABNORMAL HIGH (ref 0–44)
AST: 38 IU/L (ref 0–40)
Albumin: 4.2 g/dL (ref 3.9–4.9)
Alkaline Phosphatase: 105 IU/L (ref 44–121)
BUN/Creatinine Ratio: 10 (ref 10–24)
BUN: 11 mg/dL (ref 8–27)
Bilirubin Total: 0.6 mg/dL (ref 0.0–1.2)
CO2: 22 mmol/L (ref 20–29)
Calcium: 9.5 mg/dL (ref 8.6–10.2)
Chloride: 100 mmol/L (ref 96–106)
Creatinine, Ser: 1.09 mg/dL (ref 0.76–1.27)
Globulin, Total: 2.7 g/dL (ref 1.5–4.5)
Glucose: 124 mg/dL — ABNORMAL HIGH (ref 70–99)
Potassium: 4.1 mmol/L (ref 3.5–5.2)
Sodium: 135 mmol/L (ref 134–144)
Total Protein: 6.9 g/dL (ref 6.0–8.5)
eGFR: 76 mL/min/1.73 (ref >59)

## 2024-04-23 LAB — LIPID PANEL
Chol/HDL Ratio: 3.2 ratio (ref 0.0–5.0)
Cholesterol, Total: 250 mg/dL — ABNORMAL HIGH (ref 100–199)
HDL: 77 mg/dL (ref >39)
LDL Chol Calc (NIH): 163 mg/dL — ABNORMAL HIGH (ref 0–99)
Triglycerides: 62 mg/dL (ref 0–149)
VLDL Cholesterol Cal: 10 mg/dL (ref 5–40)

## 2024-04-23 LAB — PSA TOTAL (REFLEX TO FREE): Prostate Specific Ag, Serum: 2.3 ng/mL (ref 0.0–4.0)

## 2024-04-29 ENCOUNTER — Other Ambulatory Visit (HOSPITAL_COMMUNITY): Payer: Self-pay

## 2024-05-01 ENCOUNTER — Ambulatory Visit
Admission: RE | Admit: 2024-05-01 | Discharge: 2024-05-01 | Disposition: A | Discharge status: Home / Self Care | Attending: Gastroenterology | Admitting: Gastroenterology

## 2024-05-01 ENCOUNTER — Ambulatory Visit: Admitting: Anesthesiology

## 2024-05-01 ENCOUNTER — Encounter
Admission: RE | Disposition: A | Discharge status: Home / Self Care | Payer: Self-pay | Source: Home / Self Care | Attending: Gastroenterology

## 2024-05-01 ENCOUNTER — Encounter: Payer: Self-pay | Admitting: Gastroenterology

## 2024-05-01 ENCOUNTER — Telehealth: Payer: Self-pay

## 2024-05-01 ENCOUNTER — Other Ambulatory Visit: Payer: Self-pay

## 2024-05-01 DIAGNOSIS — I10 Essential (primary) hypertension: Secondary | ICD-10-CM | POA: Diagnosis not present

## 2024-05-01 DIAGNOSIS — Z1211 Encounter for screening for malignant neoplasm of colon: Secondary | ICD-10-CM | POA: Insufficient documentation

## 2024-05-01 DIAGNOSIS — K640 First degree hemorrhoids: Secondary | ICD-10-CM | POA: Insufficient documentation

## 2024-05-01 DIAGNOSIS — Z87891 Personal history of nicotine dependence: Secondary | ICD-10-CM | POA: Diagnosis not present

## 2024-05-01 DIAGNOSIS — J4530 Mild persistent asthma, uncomplicated: Secondary | ICD-10-CM

## 2024-05-01 HISTORY — PX: COLONOSCOPY: SHX5424

## 2024-05-01 SURGERY — COLONOSCOPY
Anesthesia: General

## 2024-05-01 MED ORDER — FLUTICASONE FUROATE-VILANTEROL 100-25 MCG/ACT IN AEPB: 1.0000 | INHALATION_SPRAY | Freq: Every day | RESPIRATORY_TRACT | 5 refills | Status: DC

## 2024-05-01 MED ORDER — DEXMEDETOMIDINE HCL IN NACL 80 MCG/20ML IV SOLN
INTRAVENOUS | Status: DC | PRN
  Administered 2024-05-01: 8 ug via INTRAVENOUS
  Administered 2024-05-01: 12 ug via INTRAVENOUS

## 2024-05-01 MED ORDER — PROPOFOL 500 MG/50ML IV EMUL
INTRAVENOUS | Status: DC | PRN
  Administered 2024-05-01: 75 ug/kg/min via INTRAVENOUS

## 2024-05-01 MED ORDER — SODIUM CHLORIDE 0.9 % IV SOLN: INTRAVENOUS | Status: DC

## 2024-05-01 MED ORDER — LIDOCAINE HCL (PF) 2 % IJ SOLN
INTRAMUSCULAR | Status: AC
  Filled 2024-05-01: qty 5

## 2024-05-01 MED ORDER — DEXMEDETOMIDINE HCL IN NACL 80 MCG/20ML IV SOLN
INTRAVENOUS | Status: AC
  Filled 2024-05-01: qty 20

## 2024-05-01 MED ORDER — PROPOFOL 10 MG/ML IV BOLUS
INTRAVENOUS | Status: DC | PRN
  Administered 2024-05-01 (×2): 40 mg via INTRAVENOUS

## 2024-05-01 MED ORDER — LIDOCAINE HCL (CARDIAC) PF 100 MG/5ML IV SOSY
PREFILLED_SYRINGE | INTRAVENOUS | Status: DC | PRN
  Administered 2024-05-01: 60 mg via INTRAVENOUS

## 2024-05-01 NOTE — Telephone Encounter (Signed)
Prescription resend 

## 2024-05-01 NOTE — H&P (Signed)
 Ruel Kung , MD [address], [place], [place], [address] Phone: [phone] Fax: [phone]  Primary Care Physician:  Donzella Lauraine SAILOR, DO   Pre-Procedure History & Physical: HPI:  Grant Golden is a 63 y.o. male is here for an colonoscopy.   Past Medical History:  Diagnosis Date   Allergic rhinitis    Arthritis    shoulders   Hyperlipidemia    Hypertension    Wears dentures    partail upper and lower    Past Surgical History:  Procedure Laterality Date   COLONOSCOPY  07/07/2010   entire colon was normal repeat in 43yrs   HERNIA REPAIR Left     Prior to Admission medications   Medication Sig Start Date End Date Taking? Authorizing Provider  albuterol  (VENTOLIN  HFA) 108 (90 Base) MCG/ACT inhaler Inhale 2 puffs into the lungs every 6 (six) hours as needed for wheezing or shortness of breath. 04/22/24  Yes Pardue, Lauraine SAILOR, DO  atorvastatin  (LIPITOR) 10 MG tablet Take 1 tablet (10 mg total) by mouth daily. 04/22/24  Yes Pardue, Lauraine SAILOR, DO  fluticasone  furoate-vilanterol (BREO ELLIPTA ) 100-25 MCG/ACT AEPB Inhale 1 puff into the lungs daily. 04/22/24  Yes Pardue, Lauraine SAILOR, DO  losartan  (COZAAR ) 25 MG tablet Take 1 tablet (25 mg total) by mouth daily. 04/22/24  Yes Pardue, Lauraine SAILOR, DO  tadalafil  (CIALIS ) 5 MG tablet Take 1 tablet (5 mg total) by mouth daily. 04/22/24  Yes Donzella Lauraine SAILOR, DO    Allergies as of 04/06/2024   (No Known Allergies)    Family History  Problem Relation Age of Onset   Arthritis Father    Heart Problems Father    Heart disease Sister    Heart Problems Brother     Social History   Socioeconomic History   Marital status: Married    Spouse name: Not on file   Number of children: Not on file   Years of education: Not on file   Highest education level: 12th grade  Occupational History   Not on file  Tobacco  Use   Smoking status: Former    Current packs/day: 0.00    Types: Cigarettes    Quit date: 2001    Years since quitting: 24.7   Smokeless tobacco: Never   Tobacco comments:    Quit smoking back in 2001  Vaping Use   Vaping status: Never Used  Substance and Sexual Activity   Alcohol use: Yes    Alcohol/week: 12.0 standard drinks of alcohol    Types: 12 Cans of beer per week   Drug use: No   Sexual activity: Yes  Other Topics Concern   Not on file  Social History Narrative   Not on file   Social Drivers of Health   Financial Resource Strain: Low Risk  (04/21/2024)   Overall Financial Resource Strain (CARDIA)    Difficulty of Paying Living Expenses: Not very hard  Food Insecurity: Food Insecurity Present (04/21/2024)   Hunger Vital Sign    Worried About Running Out of Food in the Last Year: Sometimes true    Ran Out of Food in the Last Year: Never true  Transportation Needs: No Transportation Needs (04/21/2024)   PRAPARE - Administrator, Civil Service (Medical): No    Lack of Transportation (Non-Medical): No  Physical Activity: Insufficiently Active (04/21/2024)   Exercise Vital Sign    Days of Exercise per Week: 2 days    Minutes of  Exercise per Session: 10 min  Stress: No Stress Concern Present (04/21/2024)   [employer] of Occupational Health - Occupational Stress Questionnaire    Feeling of Stress: Not at all  Social Connections: Moderately Isolated (04/21/2024)   Social Connection and Isolation Panel    Frequency of Communication with Friends and Family: Once a week    Frequency of Social Gatherings with Friends and Family: Never    Attends Religious Services: More than 4 times per year    Active Member of [employer] or Organizations: No    Attends Engineer, structural: Not on file    Marital Status: Married  Catering manager Violence: Not on file    Review of Systems: See HPI, otherwise negative ROS  Physical Exam: There were no vitals taken for  this visit. General:   Alert,  pleasant and cooperative in NAD Head:  Normocephalic and atraumatic. Neck:  Supple; no masses or thyromegaly. Lungs:  Clear throughout to auscultation, normal respiratory effort.    Heart:  +S1, +S2, Regular rate and rhythm, No edema. Abdomen:  Soft, nontender and nondistended. Normal bowel sounds, without guarding, and without rebound.   Neurologic:  Alert and  oriented x4;  grossly normal neurologically.  Impression/Plan: Grant Golden is here for an colonoscopy to be performed for Screening colonoscopy average risk   Risks, benefits, limitations, and alternatives regarding  colonoscopy have been reviewed with the patient.  Questions have been answered.  All parties agreeable.   Ruel Kung, MD  05/01/2024, 8:32 AM

## 2024-05-01 NOTE — Telephone Encounter (Signed)
 Copied from CRM [id number]. Topic: Clinical - Prescription Issue >> May 01, 2024 11:27 AM Delon DASEN wrote: Reason for CRM: fluticasone  furoate-vilanterol (BREO ELLIPTA ) 100-25 MCG/ACT AEPB- was not received by pharmacy

## 2024-05-01 NOTE — Transfer of Care (Signed)
 Immediate Anesthesia Transfer of Care Note  Patient: Grant Golden  Procedure(s) Performed: COLONOSCOPY  Patient Location: PACU  Anesthesia Type:General  Level of Consciousness: sedated  Airway & Oxygen Therapy: Patient Spontanous Breathing  Post-op Assessment: Report given to RN and Post -op Vital signs reviewed and stable  Post vital signs: Reviewed and stable  Last Vitals:  Vitals Value Taken Time  BP 102/69 05/01/24 09:39  Temp    Pulse 65 05/01/24 09:39  Resp 17 05/01/24 09:39  SpO2 99 % 05/01/24 09:39    Last Pain:  Vitals:   05/01/24 0939  TempSrc:   PainSc: Asleep         Complications: No notable events documented.

## 2024-05-01 NOTE — Op Note (Signed)
 [facility] Patient Name: Grant Golden Procedure Date: 05/01/2024 9:11 AM MRN: [id number] Account #: [id number] Date of Birth: 01/21/1961 Admit Type: Outpatient Age: 63 Room: [facility] ENDO ROOM 4 Gender: Male Note Status: Finalized Instrument Name: Colon Scope [id number] Procedure:             Colonoscopy Indications:           Screening for colorectal malignant neoplasm Providers:             Ruel Kung MD, MD Referring MD:          Lauraine GEANNIE Buoy (Referring MD) Medicines:             Monitored Anesthesia Care Complications:         No immediate complications. Procedure:             Pre-Anesthesia Assessment:                        - Prior to the procedure, a History and Physical was                         performed, and patient medications, allergies and                         sensitivities were reviewed. The patient's tolerance                         of previous anesthesia was reviewed.                        - The risks and benefits of the procedure and the                         sedation options and risks were discussed with the                         patient. All questions were answered and informed                         consent was obtained.                        - ASA Grade Assessment: II - A patient with mild                         systemic disease.                        After obtaining informed consent, the colonoscope was                         passed under direct vision. Throughout the procedure,                         the patient's blood pressure, pulse, and oxygen                         saturations were monitored continuously. The                         Colonoscope was  introduced through the anus and                         advanced to the the cecum, identified by the                         appendiceal orifice. The colonoscopy was performed                         with ease. The patient tolerated the procedure well.                          The quality of the bowel preparation was excellent.                         The ileocecal valve, appendiceal orifice, and rectum                         were photographed. Findings:      The perianal and digital rectal examinations were normal.      Internal hemorrhoids were found during retroflexion. The hemorrhoids       were large and Grade I (internal hemorrhoids that do not prolapse).      The exam was otherwise without abnormality on direct and retroflexion       views. Impression:            - Internal hemorrhoids.                        - The examination was otherwise normal on direct and                         retroflexion views.                        - No specimens collected. Recommendation:        - Discharge patient to home (with escort).                        - Resume previous diet.                        - Continue present medications.                        - Repeat colonoscopy in 10 years for screening                         purposes. Procedure Code(s):     --- Professional ---                        [phone], Colonoscopy, flexible; diagnostic, including                         collection of specimen(s) by brushing or washing, when                         performed (separate procedure) Diagnosis Code(s):     --- Professional ---  Z12.11, Encounter for screening for malignant neoplasm                         of colon                        K64.0, First degree hemorrhoids CPT copyright 2022 American Medical Association. All rights reserved. The codes documented in this report are preliminary and upon coder review may  be revised to meet current compliance requirements. Ruel Kung, MD Ruel Kung MD, MD 05/01/2024 9:39:20 AM This report has been signed electronically. Number of Addenda: 0 Note Initiated On: 05/01/2024 9:11 AM Scope Withdrawal Time: 0 hours 9 minutes 26 seconds  Total Procedure Duration: 0 hours 11 minutes 48  seconds  Estimated Blood Loss:  Estimated blood loss: none.      [facility]

## 2024-05-01 NOTE — Anesthesia Preprocedure Evaluation (Addendum)
 Anesthesia Evaluation  Patient identified by MRN, date of birth, ID band Patient awake    Reviewed: Allergy & Precautions, NPO status , Patient's Chart, lab work & pertinent test results  Airway Mallampati: II  TM Distance: >3 FB Neck ROM: full    Dental  (+) Teeth Intact   Pulmonary Patient abstained from smoking., former smoker   Pulmonary exam normal        Cardiovascular hypertension, Pt. on medications Normal cardiovascular exam Rhythm:Regular Rate:Normal     Neuro/Psych negative neurological ROS  negative psych ROS   GI/Hepatic negative GI ROS, Neg liver ROS,,,  Endo/Other  negative endocrine ROS    Renal/GU negative Renal ROS  negative genitourinary   Musculoskeletal   Abdominal Normal abdominal exam  (+)   Peds negative pediatric ROS (+)  Hematology negative hematology ROS (+)   Anesthesia Other Findings Past Medical History: No date: Allergic rhinitis No date: Arthritis     Comment:  shoulders No date: Hyperlipidemia No date: Hypertension No date: Wears dentures     Comment:  partail upper and lower  Past Surgical History: 07/07/2010: COLONOSCOPY     Comment:  entire colon was normal repeat in 71yrs No date: HERNIA REPAIR; Left  BMI    Body Mass Index: 29.13 kg/m      Reproductive/Obstetrics negative OB ROS                              Anesthesia Physical Anesthesia Plan  ASA: 3  Anesthesia Plan: General   Post-op Pain Management:    Induction: Intravenous  PONV Risk Score and Plan: Propofol  infusion and TIVA  Airway Management Planned: Natural Airway and Nasal Cannula  Additional Equipment:   Intra-op Plan:   Post-operative Plan:   Informed Consent: I have reviewed the patients History and Physical, chart, labs and discussed the procedure including the risks, benefits and alternatives for the proposed anesthesia with the patient or authorized  representative who has indicated his/her understanding and acceptance.     Dental Advisory Given  Plan Discussed with: CRNA  Anesthesia Plan Comments:          Anesthesia Quick Evaluation

## 2024-05-01 NOTE — Anesthesia Postprocedure Evaluation (Signed)
 Anesthesia Post Note  Patient: Grant Golden  Procedure(s) Performed: COLONOSCOPY  Patient location during evaluation: PACU Anesthesia Type: General Level of consciousness: awake Pain management: satisfactory to patient Vital Signs Assessment: post-procedure vital signs reviewed and stable Respiratory status: spontaneous breathing Cardiovascular status: stable Anesthetic complications: no   No notable events documented.   Last Vitals:  Vitals:   05/01/24 0939 05/01/24 0949  BP: 102/69 120/78  Pulse: 65 62  Resp: 17 18  Temp:    SpO2: 99% 100%    Last Pain:  Vitals:   05/01/24 0949  TempSrc:   PainSc: 0-No pain                 VAN STAVEREN,Endy Easterly

## 2024-05-04 ENCOUNTER — Other Ambulatory Visit: Payer: Self-pay | Admitting: Family Medicine

## 2024-05-04 ENCOUNTER — Ambulatory Visit: Payer: Self-pay | Admitting: Family Medicine

## 2024-05-04 DIAGNOSIS — J4530 Mild persistent asthma, uncomplicated: Secondary | ICD-10-CM

## 2024-05-04 NOTE — Telephone Encounter (Unsigned)
 Copied from CRM [id number]. Topic: Clinical - Medication Refill >> May 04, 2024  1:52 PM Zy'onna H wrote: Medication:  fluticasone  furoate-vilanterol (BREO ELLIPTA ) 100-25 MCG/ACT AEPB   Has the patient contacted their pharmacy? Yes (Agent: If no, request that the patient contact the pharmacy for the refill. If patient does not wish to contact the pharmacy document the reason why and proceed with request.) (Agent: If yes, when and what did the pharmacy advise?)  This is the patient's preferred pharmacy:  [id number] - [place], [place] - [address] [place] [address] Phone: [phone] Fax: [phone]  Is this the correct pharmacy for this prescription? Yes If no, delete pharmacy and type the correct one.   Has the prescription been filled recently? No  Is the patient out of the medication? Yes  Has the patient been seen for an appointment in the last year OR does the patient have an upcoming appointment? Yes  Can we respond through MyChart? Yes  Agent: Please be advised that Rx refills may take up to 3 business days. We ask that you follow-up with your pharmacy.

## 2024-05-05 NOTE — Telephone Encounter (Signed)
 Copied from CRM [id number]. Topic: Clinical - Prescription Issue >> May 05, 2024  9:54 AM Turkey B wrote: Reason for CRM: patient called in, was told by pharmacy denied because, Fluticasone  Furoate-Vilanterol  isnt covered under his insurance. Please cb for further assistance

## 2024-05-05 NOTE — Telephone Encounter (Signed)
 Patient returned call about lab results, inquired about previous message. Requesting callback, [phone]

## 2024-05-06 ENCOUNTER — Telehealth: Payer: Self-pay

## 2024-05-06 DIAGNOSIS — J4530 Mild persistent asthma, uncomplicated: Secondary | ICD-10-CM

## 2024-05-06 NOTE — Telephone Encounter (Signed)
 Copied from CRM #[id number]. Topic: Clinical - Prescription Issue >> May 06, 2024  8:42 AM Tiffini S wrote: Reason for CRM: Patient called stating that the insurance will not cover the medication for  fluticasone  furoate-vilanterol (BREO ELLIPTA ) 100-25 MCG/ACT AEPB- patient is asking that the prescription be resent to: [id number] - [place], [place] - [address] [place] [address] Phone: [phone] Fax: [phone]  Please call the patient for an update at [phone]. >> May 06, 2024  3:23 PM Delon T wrote: Calling for follo up and possible different prescription that will be covered by insurance- [phone]

## 2024-05-06 NOTE — Telephone Encounter (Unsigned)
 Copied from CRM #[id number]. Topic: Clinical - Prescription Issue >> May 06, 2024  8:42 AM Tiffini S wrote: Reason for CRM: Patient called stating that the insurance will not cover the medication for  fluticasone  furoate-vilanterol (BREO ELLIPTA ) 100-25 MCG/ACT AEPB- patient is asking that the prescription be resent to: [id number] - [place], [place] - [address] [place] [address] Phone: [phone] Fax: [phone]  Please call the patient for an update at [phone].

## 2024-05-07 ENCOUNTER — Other Ambulatory Visit (HOSPITAL_COMMUNITY): Payer: Self-pay

## 2024-05-07 ENCOUNTER — Telehealth: Payer: Self-pay

## 2024-05-07 NOTE — Telephone Encounter (Signed)
 Pharmacy Patient Advocate Encounter   Received notification from Onbase that prior authorization for Breo Ellipta  100-25 MCG/ACT  is required/requested.   Insurance verification completed.   The patient is insured through [employer] .   Per test claim: The current 30 day co-pay is, $88.00.  No PA needed at this time. This test claim was processed through [facility]- copay amounts may vary at other pharmacies due to pharmacy/plan contracts, or as the patient moves through the different stages of their insurance plan.

## 2024-05-08 MED ORDER — FLUTICASONE-SALMETEROL 100-50 MCG/ACT IN AEPB: 1.0000 | INHALATION_SPRAY | Freq: Two times a day (BID) | RESPIRATORY_TRACT | 3 refills | Status: DC

## 2024-05-08 NOTE — Progress Notes (Signed)
 Seen by patient Grant Golden on 05/07/2024 10:22 AM

## 2024-05-08 NOTE — Addendum Note (Signed)
 Addended byBETHA DONZELLA DOMINO on: 05/08/2024 07:59 AM   Modules accepted: Orders

## 2024-08-17 ENCOUNTER — Other Ambulatory Visit (HOSPITAL_COMMUNITY): Payer: Self-pay

## 2024-08-17 ENCOUNTER — Telehealth: Payer: Self-pay | Admitting: Pharmacy Technician

## 2024-08-17 NOTE — Telephone Encounter (Signed)
 Pharmacy Patient Advocate Encounter  Received notification from [facility] that Prior Authorization for Tadalafil  5MG  tablets has been DENIED.  Full denial letter will be uploaded to the media tab. See denial reason below.   PA #/Case ID/Reference #: [id number]

## 2024-08-17 NOTE — Telephone Encounter (Signed)
 Called and informed patient that his prior authorization for Tadalafil  was denied, he verbalized understanding.

## 2024-08-17 NOTE — Telephone Encounter (Signed)
 Pharmacy Patient Advocate Encounter   Received notification from Onbase that prior authorization for Tadalafil  5MG  tablets is required/requested.   Insurance verification completed.   The patient is insured through [employer].   Per test claim: PA required; PA submitted to above mentioned insurance via Latent Key/confirmation #/EOC [id number] Status is pending

## 2024-09-17 ENCOUNTER — Other Ambulatory Visit: Payer: Self-pay | Admitting: Family Medicine

## 2024-09-17 DIAGNOSIS — J4530 Mild persistent asthma, uncomplicated: Secondary | ICD-10-CM

## 2024-10-20 ENCOUNTER — Ambulatory Visit: Admitting: Family Medicine
